# Patient Record
Sex: Male | Born: 1980
Health system: Southern US, Community
[De-identification: ages and names within clinical notes are randomized; demographics above are authoritative.]

## PROBLEM LIST (undated history)

## (undated) DIAGNOSIS — F429 Obsessive-compulsive disorder, unspecified: Secondary | ICD-10-CM

## (undated) HISTORY — DX: Obsessive-compulsive disorder, unspecified: F42.9

---
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
## 2012-01-22 ENCOUNTER — Ambulatory Visit (INDEPENDENT_AMBULATORY_CARE_PROVIDER_SITE_OTHER): Payer: BC Managed Care – PPO | Admitting: Family Medicine

## 2012-01-22 VITALS — BP 112/70 | HR 92 | Temp 97.7°F | Resp 16 | Ht 69.5 in | Wt 148.0 lb

## 2012-01-22 DIAGNOSIS — F429 Obsessive-compulsive disorder, unspecified: Secondary | ICD-10-CM | POA: Insufficient documentation

## 2012-01-22 DIAGNOSIS — R112 Nausea with vomiting, unspecified: Secondary | ICD-10-CM

## 2012-01-22 DIAGNOSIS — F329 Major depressive disorder, single episode, unspecified: Secondary | ICD-10-CM

## 2012-01-22 DIAGNOSIS — R1084 Generalized abdominal pain: Secondary | ICD-10-CM

## 2012-01-22 LAB — POCT URINALYSIS DIPSTICK
Leukocytes, UA: NEGATIVE
Nitrite, UA: NEGATIVE
Urobilinogen, UA: 0.2
pH, UA: 8.5

## 2012-01-22 LAB — COMPREHENSIVE METABOLIC PANEL
ALT: 21 U/L (ref 0–53)
AST: 23 U/L (ref 0–37)
Calcium: 10.3 mg/dL (ref 8.4–10.5)
Chloride: 104 mEq/L (ref 96–112)
Creat: 0.94 mg/dL (ref 0.50–1.35)
Sodium: 139 mEq/L (ref 135–145)
Total Protein: 7.9 g/dL (ref 6.0–8.3)

## 2012-01-22 LAB — POCT CBC
Granulocyte percent: 90.3 %G — AB (ref 37–80)
Lymph, poc: 0.9 (ref 0.6–3.4)
MCHC: 32.7 g/dL (ref 31.8–35.4)
MPV: 10.3 fL (ref 0–99.8)
POC Granulocyte: 11.6 — AB (ref 2–6.9)
POC LYMPH PERCENT: 6.9 %L — AB (ref 10–50)
POC MID %: 2.8 %M (ref 0–12)
Platelet Count, POC: 285 10*3/uL (ref 142–424)
RDW, POC: 13.5 %

## 2012-01-22 LAB — POCT UA - MICROSCOPIC ONLY
Mucus, UA: POSITIVE
Yeast, UA: NEGATIVE

## 2012-01-22 MED ORDER — ONDANSETRON 8 MG PO TBDP
8.0000 mg | ORAL_TABLET | Freq: Three times a day (TID) | ORAL | Status: AC | PRN
Start: 2012-01-22 — End: 2012-01-29

## 2012-01-22 NOTE — Progress Notes (Signed)
  Subjective:    Patient ID: Jeffrey Murillo, male    DOB: 14-Jun-1981, 31 y.o.   MRN: [id number]  HPI  One month ago patient developed acute illness of tactile fever, chills, nausea, vomiting and diarrhea.  After one week patient back to baseline. Today woke up with nausea and abdominal pain after eating out at [employer]. Patient complains of tactile fever and chills.   Patient concerned that he has cholelithiasis, as it "runs in the family"  Very spicy diet. No difficulty with dairy or wheat products.  Patient relates to gastrointestinal distress since childhood.   Review of Systems Joint pains, though working out more No weight loss Night sweats  No oral ulcers Objective:   Physical Exam  Constitutional: He appears well-developed and well-nourished.  HENT:  Head: Normocephalic and atraumatic.  Neck: Neck supple.  Cardiovascular: Normal rate and regular rhythm.   Abdominal: Normal appearance and bowel sounds are normal. There is no hepatosplenomegaly. There is tenderness (RUQ pain + murphy sign). There is positive Murphy's sign.          Assessment & Plan:   1. Abdominal pain POCT CBC, POCT urinalysis dipstick, POCT UA - Microscopic Only, Comprehensive metabolic panel, POCT SEDIMENTATION RATE, Lipase, Tissue transglutaminase, IgA  2. OCD ondansetron (ZOFRAN ODT) 8 MG disintegrating tablet, US Abdomen Complete

## 2012-01-23 NOTE — Progress Notes (Signed)
Quick Note:  Please call patient that labs to date are normal. Keep f/u with Dr. Marylene Land! KR  ______

## 2012-01-24 ENCOUNTER — Telehealth: Payer: Self-pay | Admitting: Family Medicine

## 2012-01-24 NOTE — Telephone Encounter (Signed)
Spoke with pt and he is doing ok. He has appt on feb 13th at 730. He will see you then

## 2012-01-30 ENCOUNTER — Ambulatory Visit
Admission: RE | Admit: 2012-01-30 | Discharge: 2012-01-30 | Disposition: A | Discharge status: Home / Self Care | Payer: BC Managed Care – PPO | Source: Ambulatory Visit | Attending: Family Medicine | Admitting: Family Medicine

## 2012-02-01 NOTE — Telephone Encounter (Signed)
Pt CB and explained the [place] results to pt. Pt wanted me to ask Dr Milus Glazier if he thinks pt should do a repeat US in 6 -12 months. Asked Dr L and then reported to pt that Dr L does not think repeat is necessary. Pt agreed and will RTC is Sxs return. Reports Sxs are gone now.

## 2012-02-01 NOTE — Telephone Encounter (Signed)
Pt would like to know the results of the ultrasound of his abdomen.

## 2012-02-01 NOTE — Telephone Encounter (Signed)
Please see US results

## 2012-02-01 NOTE — Telephone Encounter (Signed)
Dr. Hal Hope is out of the office until Monday.  [place] was negative for any cause of his symptoms, no gallstones, no inflammation.  A very small hemangioma (blood filled cyst) was present on the top of his liver.  This is likely an incidental finding and a repeat US could be repeated at 6-12 months.   Please RTC if Sx persist.

## 2012-02-01 NOTE — Telephone Encounter (Signed)
LMOM TO CB 

## 2012-04-24 ENCOUNTER — Other Ambulatory Visit: Payer: Self-pay | Admitting: Family Medicine

## 2012-05-26 ENCOUNTER — Other Ambulatory Visit: Payer: Self-pay | Admitting: Family Medicine

## 2012-06-22 ENCOUNTER — Ambulatory Visit (INDEPENDENT_AMBULATORY_CARE_PROVIDER_SITE_OTHER): Payer: BC Managed Care – PPO | Admitting: Family Medicine

## 2012-06-22 VITALS — BP 122/62 | HR 60 | Temp 97.8°F | Resp 14 | Ht 70.5 in | Wt 149.8 lb

## 2012-06-22 DIAGNOSIS — B079 Viral wart, unspecified: Secondary | ICD-10-CM

## 2012-06-22 NOTE — Progress Notes (Signed)
31 yo with right handed warts.  Did well with liquid N2 before and they almost went away  O:  5 small hand warts treated with liquid N2  A:  Simple warts  P:  Recheck in 3 weeks.

## 2012-07-25 ENCOUNTER — Ambulatory Visit (INDEPENDENT_AMBULATORY_CARE_PROVIDER_SITE_OTHER): Payer: BC Managed Care – PPO | Admitting: Family Medicine

## 2012-07-25 VITALS — BP 113/69 | HR 62 | Temp 97.5°F | Resp 16 | Ht 69.5 in | Wt 148.8 lb

## 2012-07-25 DIAGNOSIS — J029 Acute pharyngitis, unspecified: Secondary | ICD-10-CM

## 2012-07-25 NOTE — Progress Notes (Signed)
 @  [facility]@   Patient ID: Jeffrey Murillo MRN: [id number], DOB: 1981/03/29, 31 y.o. Date of Encounter: 07/25/2012, 3:27 PM  Primary Physician: No primary provider on file.  Chief Complaint:  Chief Complaint  Patient presents with  . Sore Throat    x 3-4 days gets worse - glands swollen    HPI: 31 y.o. year old male presents with day history of sore throat. Subjective fever and chills. No cough, congestion, rhinorrhea, sinus pressure, otalgia, or headache. Normal hearing. No GI complaints. Able to swallow saliva, but hurts to do so. Decreased appetite secondary to sore throat.   No past medical history on file.   Home Meds: Prior to Admission medications   Medication Sig Start Date End Date Taking? Authorizing Provider  FLUoxetine (PROZAC) 40 MG capsule TAKE ONE CAPSULE BY MOUTH ONE TIME DAILY 04/24/12  Yes Sondra Barges, PA-C    Allergies: No Known Allergies  History   Social History  . Marital Status: Unknown    Spouse Name: N/A    Number of Children: N/A  . Years of Education: N/A   Occupational History  . Not on file.   Social History Main Topics  . Smoking status: Never Smoker   . Smokeless tobacco: Not on file  . Alcohol Use: Not on file  . Drug Use: Not on file  . Sexually Active: Not on file   Other Topics Concern  . Not on file   Social History Narrative  . No narrative on file     Review of Systems: Constitutional: negative for chills, fever, night sweats or weight changes HEENT: see above Cardiovascular: negative for chest pain or palpitations Respiratory: negative for hemoptysis, wheezing, or shortness of breath Abdominal: negative for abdominal pain, nausea, vomiting or diarrhea Dermatological: negative for rash Neurologic: negative for headache   Physical Exam Blood pressure 113/69, pulse 62, temperature 97.5 F (36.4 C), temperature source Oral, resp. rate 16, height 5' 9.5" (1.765 m), weight 148 lb 12.8 oz (67.495 kg), SpO2 100.00%., Body  mass index is 21.66 kg/(m^2). General: Well developed, well nourished, in no acute distress. Head: Normocephalic, atraumatic, eyes without discharge, sclera non-icteric, nares are patent. Bilateral auditory canals clear, TM's are without perforation, pearly grey with reflective cone of light bilaterally. No sinus TTP. Oral cavity moist, dentition normal. Posterior pharynx with post nasal drip and mild erythema. No peritonsillar abscess or tonsillar exudate. Neck: Supple. No thyromegaly. Full ROM. No lymphadenopathy. Lungs: Clear bilaterally to auscultation without wheezes, rales, or rhonchi. Breathing is unlabored. Heart: RRR with S1 S2. No murmurs, rubs, or gallops appreciated. Abdomen: Soft, non-tender, non-distended with normoactive bowel sounds. No hepatomegaly. No rebound/guarding. No obvious abdominal masses. Msk:  Strength and tone normal for age. Extremities: No clubbing or cyanosis. No edema. Neuro: Alert and oriented X 3. Moves all extremities spontaneously. CNII-XII grossly in tact. Psych:  Responds to questions appropriately with a normal affect.   Labs:   ASSESSMENT AND PLAN:  31 y.o. year old male with pharyngitis 1. Pharyngitis  Culture, Group A Strep    - -Tylenol/Motrin prn -Rest/fluids -RTC precautions -RTC 3-5 days if no improvement  Signed, Elvina Sidle, MD 07/25/2012 3:27 PM

## 2012-07-27 LAB — CULTURE, GROUP A STREP: Organism ID, Bacteria: NORMAL

## 2012-09-02 ENCOUNTER — Ambulatory Visit (INDEPENDENT_AMBULATORY_CARE_PROVIDER_SITE_OTHER): Payer: BC Managed Care – PPO | Admitting: Family Medicine

## 2012-09-02 ENCOUNTER — Encounter: Payer: Self-pay | Admitting: Family Medicine

## 2012-09-02 VITALS — BP 120/62 | HR 79 | Temp 97.8°F | Resp 18 | Ht 69.5 in | Wt 148.4 lb

## 2012-09-02 DIAGNOSIS — B079 Viral wart, unspecified: Secondary | ICD-10-CM

## 2012-09-02 DIAGNOSIS — F429 Obsessive-compulsive disorder, unspecified: Secondary | ICD-10-CM

## 2012-09-02 MED ORDER — FLUOXETINE HCL 40 MG PO CAPS: 40.0000 mg | ORAL_CAPSULE | Freq: Every day | ORAL | Status: DC

## 2012-09-02 NOTE — Progress Notes (Signed)
31 yo with right handed warts. Did well with liquid N2 before and they almost went away  O: 3 small hand warts treated with liquid N2: index, middle, and back of index A: Simple warts treated

## 2012-09-02 NOTE — Addendum Note (Signed)
Addended by: Elvina Sidle on: 09/02/2012 01:49 PM   Modules accepted: Orders

## 2012-12-27 ENCOUNTER — Ambulatory Visit: Payer: BC Managed Care – PPO

## 2013-09-12 ENCOUNTER — Other Ambulatory Visit: Payer: Self-pay | Admitting: Family Medicine

## 2013-10-03 ENCOUNTER — Ambulatory Visit (INDEPENDENT_AMBULATORY_CARE_PROVIDER_SITE_OTHER): Payer: BC Managed Care – PPO | Admitting: Family Medicine

## 2013-10-03 VITALS — BP 110/68 | HR 57 | Temp 97.8°F | Resp 18 | Ht 70.0 in | Wt 154.0 lb

## 2013-10-03 DIAGNOSIS — B079 Viral wart, unspecified: Secondary | ICD-10-CM

## 2013-10-03 DIAGNOSIS — R109 Unspecified abdominal pain: Secondary | ICD-10-CM

## 2013-10-03 MED ORDER — METRONIDAZOLE 500 MG PO TABS: 500.0000 mg | ORAL_TABLET | Freq: Three times a day (TID) | ORAL | Status: DC

## 2013-10-03 NOTE — Progress Notes (Signed)
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  Also needs two warts treated.  Objective 2 warts cryo'd right index finger Normal ROM without erythema Abdomen:  Soft, nontender without mass or HSM  Assessment:  Warts and possible giardia  Abdominal pain, unspecified site - Plan: metroNIDAZOLE (FLAGYL) 500 MG tablet  Warts  Signed, Elvina Sidle, MD

## 2013-10-03 NOTE — Patient Instructions (Signed)
Warts Warts are a common viral infection. They are most commonly caused by the human papillomavirus (HPV). Warts can occur at all ages. However, they occur most frequently in older children and infrequently in the elderly. Warts may be single or multiple. Location and size varies. Warts can be spread by scratching the wart and then scratching normal skin. The life cycle of warts varies. However, most will disappear over many months to a couple years. Warts commonly do not cause problems (asymptomatic) unless they are over an area of pressure, such as the bottom of the foot. If they are large enough, they may cause pain with walking. DIAGNOSIS  Warts are most commonly diagnosed by their appearance. Tissue samples (biopsies) are not required unless the wart looks abnormal. Most warts have a rough surface, are round, oval, or irregular, and are skin-colored to light yellow, brown, or gray. They are generally less than  inch (1.3 cm), but they can be any size. TREATMENT   Observation or no treatment.  Freezing with liquid nitrogen.  High heat (cautery).  Boosting the body's immunity to fight off the wart (immunotherapy using Candida antigen).  Laser surgery.  Application of various irritants and solutions. HOME CARE INSTRUCTIONS  Follow your caregiver's instructions. No special precautions are necessary. Often, treatment may be followed by a return (recurrence) of warts. Warts are generally difficult to treat and get rid of. If treatment is done in a clinic setting, usually more than 1 treatment is required. This is usually done on only a monthly basis until the wart is completely gone. SEEK IMMEDIATE MEDICAL CARE IF: The treated skin becomes red, puffy (swollen), or painful. Document Released: 09/12/2005 Document Revised: 02/25/2012 Document Reviewed: 03/10/2010 [facility] Patient Information 2014 [place], [place]. Giardiasis Giardiasis is an infection of the small intestine with the parasite  Giardia intestinalis. Giardia intestinalis cannot be seen with the naked eye. It is often found in unclean (contaminated) water.  CAUSES  Infection can be caused by drinking contaminated water. Giardia intestinalis can also be found in some tap water. SYMPTOMS  An infection causes:  Explosive, foul smelling, watery diarrhea.  A Feeling of sickness in your stomach (nausea).  Abdominal cramps and pain. It takes about 1 to 2 weeks after ingesting infected water or food to get sick. The illness usually lasts 2 to 4 weeks. Infection in infants and children can be long lasting. DIAGNOSIS  It can be diagnosed by stool exam. Blood tests may be needed. TREATMENT  Medications can be given to shorten the course of the illness. HOME CARE INSTRUCTIONS   In areas of contamination, boil your water if possible. Filtering tap water in areas of contamination removes most Giardia. Cysts of Giardia Intestinalis are resistant to chlorine.  Be careful handling soiled undergarments and diapers. If infection is present, it is easily passed by hand to mouth. Use good hand-washing techniques.  Follow up with your caregiver as directed. SEEK MEDICAL CARE IF:  You do not get better. Document Released: 11/30/2000 Document Revised: 02/25/2012 Document Reviewed: 07/22/2008 [facility] Patient Information 2014 [place], [place].

## 2013-10-22 ENCOUNTER — Ambulatory Visit (INDEPENDENT_AMBULATORY_CARE_PROVIDER_SITE_OTHER): Payer: BC Managed Care – PPO | Admitting: Family Medicine

## 2013-10-22 DIAGNOSIS — R1013 Epigastric pain: Secondary | ICD-10-CM

## 2013-10-22 DIAGNOSIS — B079 Viral wart, unspecified: Secondary | ICD-10-CM

## 2013-10-22 DIAGNOSIS — G8929 Other chronic pain: Secondary | ICD-10-CM

## 2013-10-22 NOTE — Progress Notes (Signed)
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  They are smaller but still present  Persistent crampy abdominal pain that comes in waves.  Unrelated to N, V, D.  Made worse by food and not relieved by the flagyl we prescribed several weeks ago  Objective: NAD HEENT: normal Abdomen:  Hyperactive BS, soft, nontender with no masses Skin:  2 warts cryo'd  Assessment:  Two warts on right hand: index finger.  Persistent gastroenteritis Abdominal pain, chronic, epigastric - Plan: Ambulatory referral to Gastroenterology  Wart viral  Signed, Elvina Sidle, MD

## 2014-03-13 ENCOUNTER — Ambulatory Visit: Payer: BC Managed Care – PPO

## 2014-03-13 ENCOUNTER — Ambulatory Visit (INDEPENDENT_AMBULATORY_CARE_PROVIDER_SITE_OTHER): Payer: BC Managed Care – PPO | Admitting: Family Medicine

## 2014-03-13 VITALS — BP 108/70 | HR 62 | Temp 97.5°F | Resp 16 | Ht 69.5 in | Wt 152.0 lb

## 2014-03-13 DIAGNOSIS — R509 Fever, unspecified: Secondary | ICD-10-CM

## 2014-03-13 DIAGNOSIS — R05 Cough: Secondary | ICD-10-CM

## 2014-03-13 DIAGNOSIS — R059 Cough, unspecified: Secondary | ICD-10-CM

## 2014-03-13 MED ORDER — HYDROCODONE-HOMATROPINE 5-1.5 MG/5ML PO SYRP: 5.0000 mL | ORAL_SOLUTION | Freq: Three times a day (TID) | ORAL | Status: DC | PRN

## 2014-03-13 MED ORDER — AZITHROMYCIN 250 MG PO TABS: ORAL_TABLET | ORAL | Status: DC

## 2014-03-13 NOTE — Progress Notes (Signed)
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 His cough has become productive today. He's had no nausea vomiting but he has had some sinus congestion and purulent nasal drainage.  He comes in with his wife today because he 40 minute pneumonia. The symptoms she's experiencing are identical to the ones he had when he had a "double pneumonia" several years ago.  Objective: No acute distress HEENT: Unremarkable with exception of mucopurulent nasal discharge bilaterally Neck: Supple no adenopathy Skin: Clear branches or suspicious lesions Chest: Diffuse expiratory wheezes and rhonchi with bibasilar rales  [facility] reading (PRIMARY) by  Dr. Milus GlazierLauenstein:  Chest x-ray shows increased markings and air trapping bilaterally with a very small effusion on the right and a suggestion of slight eventration of the diaphragm on the left.  Assessment: Acute bronchitis. Patient does not appear to be in acute distress and so I think we can treat this as an outpatient pulmonary infection  Plan: Cough - Plan: DG Chest 2 View, azithromycin (ZITHROMAX Z-PAK) 250 MG tablet, HYDROcodone-homatropine (HYCODAN) 5-1.5 MG/5ML syrup  Fever - Plan: DG Chest 2 View  Signed, Elvina SidleKurt Krizia Flight, MD  .

## 2014-03-18 ENCOUNTER — Encounter: Payer: Self-pay | Admitting: Family Medicine

## 2014-08-27 ENCOUNTER — Other Ambulatory Visit: Payer: Self-pay

## 2014-08-27 MED ORDER — FLUOXETINE HCL 40 MG PO CAPS: ORAL_CAPSULE | ORAL | Status: DC

## 2014-11-15 ENCOUNTER — Other Ambulatory Visit: Payer: Self-pay | Admitting: Family Medicine

## 2014-11-15 MED ORDER — FLUOXETINE HCL 40 MG PO CAPS: ORAL_CAPSULE | ORAL | Status: DC

## 2014-11-16 ENCOUNTER — Encounter: Payer: Self-pay | Admitting: *Deleted

## 2014-11-16 NOTE — Progress Notes (Signed)
Sent message to pt via mychart

## 2015-07-03 ENCOUNTER — Ambulatory Visit (INDEPENDENT_AMBULATORY_CARE_PROVIDER_SITE_OTHER): Payer: BLUE CROSS/BLUE SHIELD

## 2015-07-03 ENCOUNTER — Ambulatory Visit (INDEPENDENT_AMBULATORY_CARE_PROVIDER_SITE_OTHER): Payer: BLUE CROSS/BLUE SHIELD | Admitting: Family Medicine

## 2015-07-03 VITALS — BP 106/68 | HR 65 | Temp 97.9°F | Resp 16 | Ht 70.0 in | Wt 152.0 lb

## 2015-07-03 DIAGNOSIS — J209 Acute bronchitis, unspecified: Secondary | ICD-10-CM

## 2015-07-03 DIAGNOSIS — Z0000 Encounter for general adult medical examination without abnormal findings: Secondary | ICD-10-CM | POA: Diagnosis not present

## 2015-07-03 DIAGNOSIS — F32A Depression, unspecified: Secondary | ICD-10-CM

## 2015-07-03 DIAGNOSIS — F329 Major depressive disorder, single episode, unspecified: Secondary | ICD-10-CM

## 2015-07-03 LAB — POCT CBC
Granulocyte percent: 54.9 %G (ref 37–80)
HCT, POC: 44.1 % (ref 43.5–53.7)
Hemoglobin: 15.2 g/dL (ref 14.1–18.1)
Lymph, poc: 1.6 (ref 0.6–3.4)
MCH, POC: 31 pg (ref 27–31.2)
MCHC: 34.5 g/dL (ref 31.8–35.4)
MCV: 89.7 fL (ref 80–97)
MID (cbc): 0.4 (ref 0–0.9)
MPV: 8.2 fL (ref 0–99.8)
POC Granulocyte: 2.5 (ref 2–6.9)
POC LYMPH PERCENT: 35.5 %L (ref 10–50)
POC MID %: 9.6 %M (ref 0–12)
Platelet Count, POC: 298 10*3/uL (ref 142–424)
RBC: 4.92 M/uL (ref 4.69–6.13)
RDW, POC: 12.7 %
WBC: 4.5 10*3/uL — AB (ref 4.6–10.2)

## 2015-07-03 LAB — POCT UA - MICROSCOPIC ONLY
Bacteria, U Microscopic: NEGATIVE
Casts, Ur, LPF, POC: NEGATIVE
Crystals, Ur, HPF, POC: NEGATIVE
Epithelial cells, urine per micros: NEGATIVE
Mucus, UA: NEGATIVE
Yeast, UA: NEGATIVE

## 2015-07-03 LAB — POCT URINALYSIS DIPSTICK
Bilirubin, UA: NEGATIVE
Blood, UA: NEGATIVE
Glucose, UA: NEGATIVE
Ketones, UA: NEGATIVE
Nitrite, UA: NEGATIVE
Protein, UA: NEGATIVE
Spec Grav, UA: 1.015
Urobilinogen, UA: 0.2
pH, UA: 8.5

## 2015-07-03 LAB — COMPLETE METABOLIC PANEL WITH GFR
ALT: 44 U/L (ref 0–53)
AST: 42 U/L — ABNORMAL HIGH (ref 0–37)
Albumin: 4.7 g/dL (ref 3.5–5.2)
Alkaline Phosphatase: 53 U/L (ref 39–117)
BUN: 23 mg/dL (ref 6–23)
CO2: 29 mEq/L (ref 19–32)
Calcium: 9.9 mg/dL (ref 8.4–10.5)
Chloride: 100 mEq/L (ref 96–112)
Creat: 1.02 mg/dL (ref 0.50–1.35)
GFR, Est African American: >89 mL/min
GFR, Est Non African American: >89 mL/min
Glucose, Bld: 65 mg/dL — ABNORMAL LOW (ref 70–99)
Potassium: 4.1 mEq/L (ref 3.5–5.3)
Sodium: 139 mEq/L (ref 135–145)
Total Bilirubin: 0.7 mg/dL (ref 0.2–1.2)
Total Protein: 7.8 g/dL (ref 6.0–8.3)

## 2015-07-03 LAB — LIPID PANEL
Cholesterol: 168 mg/dL (ref 0–200)
HDL: 79 mg/dL (ref >=40)
LDL Cholesterol: 80 mg/dL (ref 0–99)
Total CHOL/HDL Ratio: 2.1 Ratio
Triglycerides: 46 mg/dL (ref <150)
VLDL: 9 mg/dL (ref 0–40)

## 2015-07-03 LAB — POCT SEDIMENTATION RATE: POCT SED RATE: 18 mm/hr (ref 0–22)

## 2015-07-03 MED ORDER — PREDNISONE 20 MG PO TABS: ORAL_TABLET | ORAL | Status: DC

## 2015-07-03 MED ORDER — FLUOXETINE HCL 40 MG PO CAPS: ORAL_CAPSULE | ORAL | Status: DC

## 2015-07-03 MED ORDER — LEVOFLOXACIN 500 MG PO TABS: 500.0000 mg | ORAL_TABLET | Freq: Every day | ORAL | Status: DC

## 2015-07-03 NOTE — Progress Notes (Addendum)
Patient ID: Jeffrey Murillo, male   DOB: 1981-07-13, 34 y.o.   MRN: [id number]   This chart was scribed for Elvina Sidle, MD by [facility], medical scribe at [facility].The patient was seen in exam room 13 and the patient's care was started at 12:27 PM.  Patient ID: Jeffrey Murillo MRN: [id number], DOB: 1981/08/21, 34 y.o. Date of Encounter: 07/03/2015  Primary Physician: No primary care provider on file.  Chief Complaint:  Chief Complaint  Patient presents with  . URI    not getting better  . Rash  . Annual Exam   HPI:  Jeffrey Murillo is a 34 y.o. male who presents to [facility] complaining of URI symptoms ongoing for one week. Seen at an urgent clinic and given clarithromycin, on his last dose. Feel a bit more energetic but the cough has persisted. He has developed a rash on his back. Pt also has noticed a bad breath, which came before his antibiotic. He does not have an appetite. He a history of pneumonia. Recently returned from [place].   He is also here for a physical exam. He needs refill on his fluoxetine. He's also noticed that after gross the bathroom sometimes have a little bit of leakage later on. He's had no blood in his urine, no back pain, no dysuria, and no incontinence     Past Medical History  Diagnosis Date  . OCD (obsessive compulsive disorder)     Home Meds: Prior to Admission medications   Medication Sig Start Date End Date Taking? Authorizing Provider  clarithromycin (BIAXIN) 500 MG tablet Take 500 mg by mouth 2 (two) times daily.   Yes Historical Provider, MD  FLUoxetine (PROZAC) 40 MG capsule Take one capsule by mouth one time daily. 11/15/14  Yes Elvina Sidle, MD  azithromycin (ZITHROMAX Z-PAK) 250 MG tablet Take as directed on pack Patient not taking: Reported on 07/03/2015 03/13/14   Elvina Sidle, MD    Allergies: No Known Allergies  History   Social History  . Marital Status: Unknown    Spouse  Name: N/A  . Number of Children: N/A  . Years of Education: N/A   Occupational History  . Not on file.   Social History Main Topics  . Smoking status: Never Smoker   . Smokeless tobacco: Not on file  . Alcohol Use: Not on file  . Drug Use: Not on file  . Sexual Activity: Not on file   Other Topics Concern  . Not on file   Social History Narrative    Review of Systems: Constitutional: negative for chills, fever, night sweats, weight changes. Positive for fatigue. HEENT: negative for vision changes, hearing loss, rhinorrhea, ST, epistaxis, or sinus pressure. Positive for congestion Cardiovascular: negative for chest pain or palpitations Respiratory: negative for hemoptysis, shortness of breath. Positive for cough and wheezing. Abdominal: negative for abdominal pain, nausea, vomiting, diarrhea, or constipation Dermatological: positive for rash. Neurologic: negative for headache, dizziness, or syncope All other systems reviewed and are otherwise negative with the exception to those above and in the HPI.  Physical Exam: Blood pressure 106/68, pulse 65, temperature 97.9 F (36.6 C), resp. rate 16, height  (1.778 m), weight 152 lb (68.947 kg), SpO2 98 %., Body mass index is 21.81 kg/(m^2). General: Well developed, well nourished, in no acute distress. Head: Normocephalic, atraumatic, eyes without discharge, sclera non-icteric, nares are without discharge. Bilateral auditory canals clear, TM's are without perforation, pearly grey and translucent with  reflective cone of light bilaterally. Oral cavity moist, posterior pharynx without exudate, erythema, peritonsillar abscess, or post nasal drip.  Neck: Supple. No thyromegaly. Full ROM. No lymphadenopathy. Lungs: Rales in the left lower base. Heart: RRR with S1 S2. No murmurs, rubs, or gallops appreciated. Abdomen: Soft, non-tender, non-distended with normoactive bowel sounds. No hepatomegaly. No rebound/guarding. No obvious abdominal  masses. Msk:  Strength and tone normal for age. Genitalia: Normal testicles, circumcised penis, no hernia Extremities/Skin: Fine morbiliform rash on his upper torso and arms. Neuro: Alert and oriented X 3. Moves all extremities spontaneously. Gait is normal. CNII-XII grossly in tact. Psych:  Responds to questions appropriately with a normal affect.   Labs: Results for orders placed or performed in visit on 07/03/15  POCT CBC  Result Value Ref Range   WBC 4.5 (A) 4.6 - 10.2 K/uL   Lymph, poc 1.6 0.6 - 3.4   POC LYMPH PERCENT 35.5 10 - 50 %L   MID (cbc) 0.4 0 - 0.9   POC MID % 9.6 0 - 12 %M   POC Granulocyte 2.5 2 - 6.9   Granulocyte percent 54.9 37 - 80 %G   RBC 4.92 4.69 - 6.13 M/uL   Hemoglobin 15.2 14.1 - 18.1 g/dL   HCT, POC 56.244.1 13.043.5 - 53.7 %   MCV 89.7 80 - 97 fL   MCH, POC 31.0 27 - 31.2 pg   MCHC 34.5 31.8 - 35.4 g/dL   RDW, POC 86.512.7 %   Platelet Count, POC 298 142 - 424 K/uL   MPV 8.2 0 - 99.8 fL  POCT urinalysis dipstick  Result Value Ref Range   Color, UA Yellow    Clarity, UA clear    Glucose, UA neg    Bilirubin, UA neg    Ketones, UA neg    Spec Grav, UA 1.015    Blood, UA neg    pH, UA 8.5    Protein, UA neg    Urobilinogen, UA 0.2    Nitrite, UA neg    Leukocytes, UA Trace (A) Negative  POCT UA - Microscopic Only  Result Value Ref Range   WBC, Ur, HPF, POC 1-4    RBC, urine, microscopic 0-1    Bacteria, U Microscopic neg    Mucus, UA neg    Epithelial cells, urine per micros neg    Crystals, Ur, HPF, POC neg    Casts, Ur, LPF, POC neg    Yeast, UA neg    [facility] reading (PRIMARY) by  Dr. Milus GlazierLauenstein:  CXR shows hyperinflation with some peribronchial cuffing.    ASSESSMENT AND PLAN:  34 y.o. year old male with  This chart was scribed in my presence and reviewed by me personally.    ICD-9-CM ICD-10-CM   1. Acute bronchitis, unspecified organism 466.0 J20.9 DG Chest 2 View     levofloxacin (LEVAQUIN) 500 MG tablet     predniSONE (DELTASONE) 20 MG  tablet  2. Annual physical exam V70.0 Z00.00 POCT CBC     POCT urinalysis dipstick     POCT UA - Microscopic Only     POCT SEDIMENTATION RATE     COMPLETE METABOLIC PANEL WITH GFR     Lipid panel  3. Depression 311 F32.9 FLUoxetine (PROZAC) 40 MG capsule      Signed, Elvina SidleKurt Yavier Snider, MD 07/03/2015 12:27 PM

## 2015-07-03 NOTE — Patient Instructions (Signed)
Acute Bronchitis Bronchitis is inflammation of the airways that extend from the windpipe into the lungs (bronchi). The inflammation often causes mucus to develop. This leads to a cough, which is the most common symptom of bronchitis.  In acute bronchitis, the condition usually develops suddenly and goes away over time, usually in a couple weeks. Smoking, allergies, and asthma can make bronchitis worse. Repeated episodes of bronchitis may cause further lung problems.  CAUSES Acute bronchitis is most often caused by the same virus that causes a cold. The virus can spread from person to person (contagious) through coughing, sneezing, and touching contaminated objects. SIGNS AND SYMPTOMS   Cough.   Fever.   Coughing up mucus.   Body aches.   Chest congestion.   Chills.   Shortness of breath.   Sore throat.  DIAGNOSIS  Acute bronchitis is usually diagnosed through a physical exam. Your health care provider will also ask you questions about your medical history. Tests, such as chest X-rays, are sometimes done to rule out other conditions.  TREATMENT  Acute bronchitis usually goes away in a couple weeks. Oftentimes, no medical treatment is necessary. Medicines are sometimes given for relief of fever or cough. Antibiotic medicines are usually not needed but may be prescribed in certain situations. In some cases, an inhaler may be recommended to help reduce shortness of breath and control the cough. A cool mist vaporizer may also be used to help thin bronchial secretions and make it easier to clear the chest.  HOME CARE INSTRUCTIONS  Get plenty of rest.   Drink enough fluids to keep your urine clear or pale yellow (unless you have a medical condition that requires fluid restriction). Increasing fluids may help thin your respiratory secretions (sputum) and reduce chest congestion, and it will prevent dehydration.   Take medicines only as directed by your health care provider.  If  you were prescribed an antibiotic medicine, finish it all even if you start to feel better.  Avoid smoking and secondhand smoke. Exposure to cigarette smoke or irritating chemicals will make bronchitis worse. If you are a smoker, consider using nicotine gum or skin patches to help control withdrawal symptoms. Quitting smoking will help your lungs heal faster.   Reduce the chances of another bout of acute bronchitis by washing your hands frequently, avoiding people with cold symptoms, and trying not to touch your hands to your mouth, nose, or eyes.   Keep all follow-up visits as directed by your health care provider.  SEEK MEDICAL CARE IF: Your symptoms do not improve after 1 week of treatment.  SEEK IMMEDIATE MEDICAL CARE IF:  You develop an increased fever or chills.   You have chest pain.   You have severe shortness of breath.  You have bloody sputum.   You develop dehydration.  You faint or repeatedly feel like you are going to pass out.  You develop repeated vomiting.  You develop a severe headache. MAKE SURE YOU:   Understand these instructions.  Will watch your condition.  Will get help right away if you are not doing well or get worse. Document Released: 01/10/2005 Document Revised: 04/19/2014 Document Reviewed: 05/26/2013 ExitCare Patient Information 2015 [employer]. This information is not intended to replace advice given to you by your health care provider. Make sure you discuss any questions you have with your health care provider. Health Maintenance A healthy lifestyle and preventative care can promote health and wellness.  Maintain regular health, dental, and eye exams.  Eat a   healthy diet. Foods like vegetables, fruits, whole grains, low-fat dairy products, and lean protein foods contain the nutrients you need and are low in calories. Decrease your intake of foods high in solid fats, added sugars, and salt. Get information about a proper diet from your  health care provider, if necessary.  Regular physical exercise is one of the most important things you can do for your health. Most adults should get at least 150 minutes of moderate-intensity exercise (any activity that increases your heart rate and causes you to sweat) each week. In addition, most adults need muscle-strengthening exercises on 2 or more days a week.   Maintain a healthy weight. The body mass index (BMI) is a screening tool to identify possible weight problems. It provides an estimate of body fat based on height and weight. Your health care provider can find your BMI and can help you achieve or maintain a healthy weight. For males 20 years and older:  A BMI below 18.5 is considered underweight.  A BMI of 18.5 to 24.9 is normal.  A BMI of 25 to 29.9 is considered overweight.  A BMI of 30 and above is considered obese.  Maintain normal blood lipids and cholesterol by exercising and minimizing your intake of saturated fat. Eat a balanced diet with plenty of fruits and vegetables. Blood tests for lipids and cholesterol should begin at age 20 and be repeated every 5 years. If your lipid or cholesterol levels are high, you are over age 50, or you are at high risk for heart disease, you may need your cholesterol levels checked more frequently.Ongoing high lipid and cholesterol levels should be treated with medicines if diet and exercise are not working.  If you smoke, find out from your health care provider how to quit. If you do not use tobacco, do not start.  Lung cancer screening is recommended for adults aged 55-80 years who are at high risk for developing lung cancer because of a history of smoking. A yearly low-dose CT scan of the lungs is recommended for people who have at least a 30-pack-year history of smoking and are current smokers or have quit within the past 15 years. A pack year of smoking is smoking an average of 1 pack of cigarettes a day for 1 year (for example, a  30-pack-year history of smoking could mean smoking 1 pack a day for 30 years or 2 packs a day for 15 years). Yearly screening should continue until the smoker has stopped smoking for at least 15 years. Yearly screening should be stopped for people who develop a health problem that would prevent them from having lung cancer treatment.  If you choose to drink alcohol, do not have more than 2 drinks per day. One drink is considered to be 12 oz (360 mL) of beer, 5 oz (150 mL) of wine, or 1.5 oz (45 mL) of liquor.  Avoid the use of street drugs. Do not share needles with anyone. Ask for help if you need support or instructions about stopping the use of drugs.  High blood pressure causes heart disease and increases the risk of stroke. Blood pressure should be checked at least every 1-2 years. Ongoing high blood pressure should be treated with medicines if weight loss and exercise are not effective.  If you are 45-79 years old, ask your health care provider if you should take aspirin to prevent heart disease.  Diabetes screening involves taking a blood sample to check your fasting blood sugar level.   This should be done once every 3 years after age 45 if you are at a normal weight and without risk factors for diabetes. Testing should be considered at a younger age or be carried out more frequently if you are overweight and have at least 1 risk factor for diabetes.  Colorectal cancer can be detected and often prevented. Most routine colorectal cancer screening begins at the age of 50 and continues through age 75. However, your health care provider may recommend screening at an earlier age if you have risk factors for colon cancer. On a yearly basis, your health care provider may provide home test kits to check for hidden blood in the stool. A small camera at the end of a tube may be used to directly examine the colon (sigmoidoscopy or colonoscopy) to detect the earliest forms of colorectal cancer. Talk to your  health care provider about this at age 50 when routine screening begins. A direct exam of the colon should be repeated every 5-10 years through age 75, unless early forms of precancerous polyps or small growths are found.  People who are at an increased risk for hepatitis B should be screened for this virus. You are considered at high risk for hepatitis B if:  You were born in a country where hepatitis B occurs often. Talk with your health care provider about which countries are considered high risk.  Your parents were born in a high-risk country and you have not received a shot to protect against hepatitis B (hepatitis B vaccine).  You have HIV or AIDS.  You use needles to inject street drugs.  You live with, or have sex with, someone who has hepatitis B.  You are a man who has sex with other men (MSM).  You get hemodialysis treatment.  You take certain medicines for conditions like cancer, organ transplantation, and autoimmune conditions.  Hepatitis C blood testing is recommended for all people born from 1945 through 1965 and any individual with known risk factors for hepatitis C.  Healthy men should no longer receive prostate-specific antigen (PSA) blood tests as part of routine cancer screening. Talk to your health care provider about prostate cancer screening.  Testicular cancer screening is not recommended for adolescents or adult males who have no symptoms. Screening includes self-exam, a health care provider exam, and other screening tests. Consult with your health care provider about any symptoms you have or any concerns you have about testicular cancer.  Practice safe sex. Use condoms and avoid high-risk sexual practices to reduce the spread of sexually transmitted infections (STIs).  You should be screened for STIs, including gonorrhea and chlamydia if:  You are sexually active and are younger than 24 years.  You are older than 24 years, and your health care provider tells  you that you are at risk for this type of infection.  Your sexual activity has changed since you were last screened, and you are at an increased risk for chlamydia or gonorrhea. Ask your health care provider if you are at risk.  If you are at risk of being infected with HIV, it is recommended that you take a prescription medicine daily to prevent HIV infection. This is called pre-exposure prophylaxis (PrEP). You are considered at risk if:  You are a man who has sex with other men (MSM).  You are a heterosexual man who is sexually active with multiple partners.  You take drugs by injection.  You are sexually active with a partner who has HIV.    Talk with your health care provider about whether you are at high risk of being infected with HIV. If you choose to begin PrEP, you should first be tested for HIV. You should then be tested every 3 months for as long as you are taking PrEP.  Use sunscreen. Apply sunscreen liberally and repeatedly throughout the day. You should seek shade when your shadow is shorter than you. Protect yourself by wearing long sleeves, pants, a wide-brimmed hat, and sunglasses year round whenever you are outdoors.  Tell your health care provider of new moles or changes in moles, especially if there is a change in shape or color. Also, tell your health care provider if a mole is larger than the size of a pencil eraser.  A one-time screening for abdominal aortic aneurysm (AAA) and surgical repair of large AAAs by ultrasound is recommended for men aged 65-75 years who are current or former smokers.  Stay current with your vaccines (immunizations). Document Released: 05/31/2008 Document Revised: 12/08/2013 Document Reviewed: 04/30/2011 ExitCare Patient Information 2015 [employer]. This information is not intended to replace advice given to you by your health care provider. Make sure you discuss any questions you have with your health care provider.  

## 2016-08-27 ENCOUNTER — Other Ambulatory Visit: Payer: Self-pay | Admitting: Family Medicine

## 2016-08-27 DIAGNOSIS — F32A Depression, unspecified: Secondary | ICD-10-CM

## 2016-08-27 DIAGNOSIS — F329 Major depressive disorder, single episode, unspecified: Secondary | ICD-10-CM

## 2016-08-27 MED ORDER — FLUOXETINE HCL 40 MG PO CAPS: ORAL_CAPSULE | ORAL | 3 refills | Status: DC

## 2016-12-06 ENCOUNTER — Encounter: Payer: Self-pay | Admitting: Family Medicine

## 2016-12-06 ENCOUNTER — Other Ambulatory Visit: Payer: 59 | Admitting: *Deleted

## 2016-12-06 ENCOUNTER — Other Ambulatory Visit: Payer: Self-pay | Admitting: Family Medicine

## 2016-12-06 DIAGNOSIS — Z0000 Encounter for general adult medical examination without abnormal findings: Secondary | ICD-10-CM

## 2016-12-06 NOTE — Progress Notes (Unsigned)
S:  Here for CPE  Some gi problems intermittently related to eating Occasional epistaxis related to dry humidity  Social hx:  Daily exercise, focusing on fitness for tennis.  Family hx: parents are in good health, remote hx prostate and throat ca in grandparents  PMHx:  No surgery, no meds, no allergies.  Objective:  Weight: 155  Height  5'10"  BP 106/62  Pulse 50  Resp 12 HEENT:  Normal with exception of mild irritation right septum Neck:  Supple, no adenop or thyromegaly Chest:  Clear Heart: reg, no murmur, gallop, or irregularity Abdomen:  Scaphoid, no masses or hepatomegaly.  Slight discomfort right lateral abdomen G-U:  Normal circ male, no hernia Ext:  Normal pulses, no deformity Back:  No scoliosis Skin:  No worrisome lesions  Assessment:  Healthy male with no acute problems. Probable IBS  Plan:  Check routine labs. Continue active, healthy lifestyle  Elvina SidleKurt Julieth Tugman, MD

## 2016-12-08 LAB — COMPREHENSIVE METABOLIC PANEL
ALT: 29 IU/L (ref 0–44)
AST: 21 IU/L (ref 0–40)
Albumin/Globulin Ratio: 1.6 (ref 1.2–2.2)
Albumin: 4.4 g/dL (ref 3.5–5.5)
Alkaline Phosphatase: 55 IU/L (ref 39–117)
BUN/Creatinine Ratio: 22 — ABNORMAL HIGH (ref 9–20)
BUN: 18 mg/dL (ref 6–20)
Bilirubin Total: 0.2 mg/dL (ref 0.0–1.2)
CO2: 25 mmol/L (ref 18–29)
Calcium: 9.3 mg/dL (ref 8.7–10.2)
Chloride: 102 mmol/L (ref 96–106)
Creatinine, Ser: 0.82 mg/dL (ref 0.76–1.27)
GFR calc Af Amer: 134 mL/min/{1.73_m2} (ref >59)
GFR calc non Af Amer: 116 mL/min/{1.73_m2} (ref >59)
Globulin, Total: 2.8 g/dL (ref 1.5–4.5)
Glucose: 82 mg/dL (ref 65–99)
Potassium: 4.1 mmol/L (ref 3.5–5.2)
Sodium: 141 mmol/L (ref 134–144)
Total Protein: 7.2 g/dL (ref 6.0–8.5)

## 2016-12-08 LAB — LIPID PANEL
Chol/HDL Ratio: 2 ratio units (ref 0.0–5.0)
Cholesterol, Total: 163 mg/dL (ref 100–199)
HDL: 81 mg/dL (ref >39)
LDL Calculated: 75 mg/dL (ref 0–99)
Triglycerides: 33 mg/dL (ref 0–149)
VLDL Cholesterol Cal: 7 mg/dL (ref 5–40)

## 2017-01-24 ENCOUNTER — Other Ambulatory Visit: Payer: Self-pay | Admitting: Sports Medicine

## 2017-01-24 ENCOUNTER — Ambulatory Visit (INDEPENDENT_AMBULATORY_CARE_PROVIDER_SITE_OTHER): Payer: 59 | Admitting: Sports Medicine

## 2017-01-24 ENCOUNTER — Ambulatory Visit
Admission: RE | Admit: 2017-01-24 | Discharge: 2017-01-24 | Disposition: A | Discharge status: Home / Self Care | Payer: 59 | Source: Ambulatory Visit | Attending: Sports Medicine | Admitting: Sports Medicine

## 2017-01-24 ENCOUNTER — Encounter: Payer: Self-pay | Admitting: Sports Medicine

## 2017-01-24 VITALS — BP 115/76 | HR 65 | Ht 70.0 in | Wt 155.0 lb

## 2017-01-24 DIAGNOSIS — M25561 Pain in right knee: Secondary | ICD-10-CM

## 2017-01-24 DIAGNOSIS — M958 Other specified acquired deformities of musculoskeletal system: Secondary | ICD-10-CM

## 2017-01-24 NOTE — Progress Notes (Signed)
  Curtis Sitesimothy Kowal - 36 y.o. male MRN [id number]  Date of birth: [birth year]  SUBJECTIVE:  Including CC & ROS.  CC: right knee pain  Marcial Pacasimothy is a 36 yo male presenting with pain and swelling in his right knee. He denies an inciting injury or event. He has a history of his right knee occasionally giving out and occasionally locking up. When he was 21, he had increased pain similar to what he has currently and was told he had a loose cartilage piece. He reports that last week, his knee gave out several times in one day. The next day, he was able to play tennis without pain, but after he played he had significant swelling in his knee. He has been icing it and taking ibuprofen but he has had pain with bending his knee and jumping.  He plays tennis and soccer. He works at [employer].    ROS: No unexpected weight loss, fever, chills, swelling, instability, muscle pain, numbness/tingling, redness, otherwise see HPI   PMHx - Updated and reviewed.  Contributory factors include: Negative PSHx - Updated and reviewed.  Contributory factors include:  Negative FHx - Updated and reviewed.  Contributory factors include:  Negative Social Hx - Updated and reviewed. Contributory factors include: Negative Medications - reviewed   PHYSICAL EXAM:  VS: BP:115/76  HR:65bpm  TEMP: ( )  RESP:   HT:5\' 10"  (177.8 cm)   WT:155 lb (70.3 kg)  BMI:22.3 PHYSICAL EXAM: Gen: NAD, alert, cooperative with exam, well-appearing HEENT: clear conjunctiva,  CV:  no edema, capillary refill brisk, normal rate Resp: non-labored Skin: no rashes, normal turgor  Neuro: no gross deficits.  Psych:  alert and oriented  Normal to inspection with no erythema or obvious bony abnormalities. Suprapatellar effusion noted. Palpation normal with no warmth, joint line tenderness, patellar tenderness, or condyle tenderness. TTP at anteromedial joint space ROM full in flexion and extension and lower leg rotation. Ligaments with solid  consistent endpoints including ACL, PCL, LCL, MCL. Negative Mcmurray's, Apley's. Positive Thessalonian tests. Non painful patellar compression. Patellar glide with crepitus bilaterally. Patellar and quadriceps tendons unremarkable. Hamstring and quadriceps strength is normal.  Hip abduction strength intact bilaterally.  Imaging: X ray right knee: osteochondral defect involving the medial right femoral condyle, right knee joint effusion  ASSESSMENT & PLAN:  Osteochondral defect, right: History and exam consistent with likely an intra-articular loose body that is causing pain, occasional locking out/giving away of knee. Possible meniscal tear given pain Thessalonianstest on exam. X ray confirmed an osteochondral defect of the right femoral condyle  Plan: - Obtain MRI to evaluate for loose body vs meniscal tear - Will refer to ortho for scope   Patient seen and evaluated with the resident. I agree with the above plan of care. Patient's x-rays show an osteochondral defect of the right medial femoral condyle. We will get an MRI to evaluate further as well as to rule out a loose body from this OCD. Phone follow-up after I reviewed that study. Patient will likely need operative intervention and we will discuss referral to orthopedics after I review his MRI.

## 2017-01-31 ENCOUNTER — Other Ambulatory Visit: Payer: 59

## 2017-06-06 ENCOUNTER — Ambulatory Visit (INDEPENDENT_AMBULATORY_CARE_PROVIDER_SITE_OTHER): Payer: 59 | Admitting: Sports Medicine

## 2017-06-06 DIAGNOSIS — M958 Other specified acquired deformities of musculoskeletal system: Secondary | ICD-10-CM | POA: Diagnosis not present

## 2017-06-06 NOTE — Progress Notes (Signed)
  Jeffrey Murillo - 36 y.o. male MRN [id number]  Date of birth: 01-Oct-1981  SUBJECTIVE:  Including CC & ROS.  CC: right knee pain  Patient presents with right knee pain that is been ongoing since February. He was seen and x-rays were performed which showed an OCD lesion in his medial femoral condyle. An MRI was ordered, but he had this done in [place]. He brings his report in today. He had a surgical opinion in which they recommended doing an open knee surgery to remove the loose fragment and do a microfracture. He would like to do this arthroscopically if possible. He also discussed the new study in which collagen was used to regenerate cartilage. He has been doing this for a month and was having good results until 5 days ago when he started trying to exercise again. He enjoys playing soccer, tennis, running, swimming, biking, weightlifting. He works at [employer].  He was discussing that he would like to try the collagen for another 6 months before doing a surgical procedure if possible.  Of note, the MRI also showed an endochondroma in the metaphysis of his distal femur. He would like this to be followed through his orthopedic oncologist in [place]. He will send him x-rays every 6 months. It is currently not bothering him.   ROS: No unexpected weight loss, fever, chills, instability, muscle pain, numbness/tingling, redness, otherwise see HPI   PMHx - Updated and reviewed.  Contributory factors include: Negative PSHx - Updated and reviewed.  Contributory factors include:  Negative FHx - Updated and reviewed.  Contributory factors include:  Negative Social Hx - Updated and reviewed. Contributory factors include: Negative Medications - reviewed   DATA REVIEWED: MRI 03/29/17 in [place]- right knee with OCD lesion of medial femoral condyle. Joint effusion present. Endochondroma in the metaphysis of distal femur  PHYSICAL EXAM:  VS: BP:(!) 104/56  HR: bpm  TEMP: ( )  RESP:   HT:    WT:    BMI:  PHYSICAL EXAM: Gen: NAD, alert, cooperative with exam, well-appearing HEENT: clear conjunctiva,  CV:  no edema, capillary refill brisk, normal rate Resp: non-labored Skin: no rashes, normal turgor  Neuro: no gross deficits.  Psych:  alert and oriented  Knee: Inspection with no erythema or but does have effusion in right knee. Palpation with no warmth, joint line tenderness, patellar tenderness, or condyle tenderness. 2+ joint effusion in right knee ROM mildly decreased in flexion of right knee, but normal in extension and lower leg rotation. Ligaments with solid consistent endpoints including ACL, PCL, LCL, MCL. Negative Mcmurray's, Apley's, and Thessalonian tests. Non painful patellar compression. Patellar glide without crepitus. Patellar and quadriceps tendons unremarkable. Hamstring and quadriceps strength is normal.     ASSESSMENT & PLAN:   Osteochondral defect of femoral condyle Will get him an ice compression unit for his knee from [employer].  Recommend avoiding activities that aggravate it. Continue collagen. He has an appointment with Dr. Thurston HoleWainer to discuss knee arthroscopy with microfracture and loose body removal.    Patient was counseled reviewing diagnosis and treatment in detail, totaling in 45 minutes, over half of which was spent in face to face counseling.

## 2017-06-06 NOTE — Assessment & Plan Note (Signed)
Will get him an ice compression unit for his knee from [employer].  Recommend avoiding activities that aggravate it. Continue collagen. He has an appointment with Dr. Thurston HoleWainer to discuss knee arthroscopy with microfracture and loose body removal.

## 2017-06-06 NOTE — Patient Instructions (Signed)
[facility] Thurston HoleWainer [address] [place] [id number]  Tuesday June 26th at 845a Arrival time is [phone]  [phone]

## 2018-01-20 ENCOUNTER — Ambulatory Visit (HOSPITAL_COMMUNITY): Admission: EM | Admit: 2018-01-20 | Discharge: 2018-01-20 | Discharge status: Against Medical Advice | Payer: 59

## 2018-01-20 NOTE — ED Notes (Signed)
Per pt access, pt did not want to wait and left the building 

## 2018-01-29 ENCOUNTER — Other Ambulatory Visit: Payer: Self-pay

## 2018-01-29 ENCOUNTER — Ambulatory Visit (INDEPENDENT_AMBULATORY_CARE_PROVIDER_SITE_OTHER): Payer: 59

## 2018-01-29 ENCOUNTER — Ambulatory Visit (HOSPITAL_COMMUNITY)
Admission: EM | Admit: 2018-01-29 | Discharge: 2018-01-29 | Disposition: A | Discharge status: Home / Self Care | Payer: 59 | Attending: Family Medicine | Admitting: Family Medicine

## 2018-01-29 ENCOUNTER — Encounter (HOSPITAL_COMMUNITY): Payer: Self-pay | Admitting: Emergency Medicine

## 2018-01-29 DIAGNOSIS — R42 Dizziness and giddiness: Secondary | ICD-10-CM | POA: Diagnosis not present

## 2018-01-29 DIAGNOSIS — R55 Syncope and collapse: Secondary | ICD-10-CM

## 2018-01-29 DIAGNOSIS — M79605 Pain in left leg: Secondary | ICD-10-CM

## 2018-01-29 DIAGNOSIS — R52 Pain, unspecified: Secondary | ICD-10-CM

## 2018-01-29 DIAGNOSIS — F329 Major depressive disorder, single episode, unspecified: Secondary | ICD-10-CM | POA: Diagnosis not present

## 2018-01-29 DIAGNOSIS — G5622 Lesion of ulnar nerve, left upper limb: Secondary | ICD-10-CM

## 2018-01-29 DIAGNOSIS — F32A Depression, unspecified: Secondary | ICD-10-CM

## 2018-01-29 MED ORDER — FLUOXETINE HCL 40 MG PO CAPS: ORAL_CAPSULE | ORAL | 1 refills | Status: DC

## 2018-01-29 NOTE — ED Provider Notes (Signed)
[facility]   [id number] 01/29/18 Arrival Time: 1939   SUBJECTIVE:  Jeffrey Murillo is a 37 y.o. male who presents to the urgent care with complaint of last few weeks  off and on L chest pain. Pt states a month ago he was running and he had chest pain with some L sided arm numbness and bilateral feet numbness. Denies CP at this time.  He has had left elbow pain and sensitivity for several months with simultaneous numbness in the flexor fingers of his left hand.  He does a lot of exercise including forearm curls.  Patient also has noted from time to time lightheadedness when he gets up quickly.  On one occasion this was associated with palpitations and sweatiness and clammy fingers.  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  Finally, patient is asking for a refill of his Prozac for 6 months Prozac spending an extended time living in [place]   Past Medical History:  Diagnosis Date  . OCD (obsessive compulsive disorder)    Family History  Problem Relation Age of Onset  . Hyperlipidemia Father    Social History   Socioeconomic History  . Marital status: Unknown    Spouse name: Not on file  . Number of children: Not on file  . Years of education: Not on file  . Highest education level: Not on file  Social Needs  . Financial resource strain: Not on file  . Food insecurity - worry: Not on file  . Food insecurity - inability: Not on file  . Transportation needs - medical: Not on file  . Transportation needs - non-medical: Not on file  Occupational History  . Not on file  Tobacco Use  . Smoking status: Never Smoker  . Smokeless tobacco: Never Used  Substance and Sexual Activity  . Alcohol use: Not on file  . Drug use: Not on file  . Sexual activity: Not on file  Other Topics Concern  . Not on file  Social History Narrative  . Not on file   No outpatient medications have been marked as taking for the 01/29/18 encounter  [facility] Encounter).   No Known Allergies    ROS: As per HPI, remainder of ROS negative.   OBJECTIVE:   Vitals:   01/29/18 2031  BP: 109/62  Pulse: 63  Resp: 18  Temp: 97.7 F (36.5 C)  SpO2: 100%     General appearance: alert; no distress Eyes: PERRL; EOMI; conjunctiva normal HENT: normocephalic; atraumatic; ; oral mucosa normal Neck: supple Lungs: clear to auscultation bilaterally Heart: regular rate and rhythm Back: no CVA tenderness Extremities: no cyanosis or edema; symmetrical with no gross deformities; tender ulnar nerve at the elbow. Skin: warm and dry Neurologic: normal gait; grossly normal Psychological: alert and cooperative; normal mood and affect      Labs:  Results for orders placed or performed in visit on 12/06/16  Lipid panel  Result Value Ref Range   Cholesterol, Total 163 100 - 199 mg/dL   Triglycerides 33 0 - 149 mg/dL   HDL 81 >11 mg/dL   VLDL Cholesterol Cal 7 5 - 40 mg/dL   LDL Calculated 75 0 - 99 mg/dL   Chol/HDL Ratio 2.0 0.0 - 5.0 ratio units  Comprehensive metabolic panel  Result Value Ref Range   Glucose 82 65 - 99 mg/dL   BUN 18 6 - 20 mg/dL   Creatinine, Ser 9.14 0.76 - 1.27 mg/dL  GFR calc non Af Amer 116 >59 mL/min/1.73   GFR calc Af Amer 134 >59 mL/min/1.73   BUN/Creatinine Ratio 22 (H) 9 - 20   Sodium 141 134 - 144 mmol/L   Potassium 4.1 3.5 - 5.2 mmol/L   Chloride 102 96 - 106 mmol/L   CO2 25 18 - 29 mmol/L   Calcium 9.3 8.7 - 10.2 mg/dL   Total Protein 7.2 6.0 - 8.5 g/dL   Albumin 4.4 3.5 - 5.5 g/dL   Globulin, Total 2.8 1.5 - 4.5 g/dL   Albumin/Globulin Ratio 1.6 1.2 - 2.2   Bilirubin Total 0.2 0.0 - 1.2 mg/dL   Alkaline Phosphatase 55 39 - 117 IU/L   AST 21 0 - 40 IU/L   ALT 29 0 - 44 IU/L    Labs Reviewed - No data to display  No results found.     ASSESSMENT & PLAN:  1. Ulnar tunnel syndrome, left   2. Pain   3. Depression   4. Postural dizziness with presyncope   5. Left leg pain      Meds ordered this encounter  Medications  . FLUoxetine (PROZAC) 40 MG capsule    Sig: Take one capsule by mouth one time daily.    Dispense:  180 capsule    Refill:  1    Please contact patient when Rx ready    Reviewed expectations re: course of current medical issues. Questions answered. Outlined signs and symptoms indicating need for more acute intervention. Patient verbalized understanding. After Visit Summary given.    Procedures:  We discussed several problems this evening: 1.  Ulnar tunnel syndrome at the left elbow causing hypersensitivity and subsequent numbness in the fingers of the left hand.  This comes from muscle hypertrophy in the forearm and unless it causes worsening symptoms, does not need treatment  2.  I will write medicine for the anxiety symptoms that you have suffered in the past since this is well controlled and this is a non-controlled medication.  3.  The symptoms you experience with dizziness are most likely secondary to a low heart rate and mild dehydration such that when you stand up quickly after bending over, cardiac output drops briefly.  4.  The bone cyst in the right femur shows no change.    Elvina SidleLauenstein, Antanisha Mohs, MD 01/29/18 2121

## 2018-01-29 NOTE — Discharge Instructions (Signed)
We discussed several problems this evening: 1.  Ulnar tunnel syndrome at the left elbow causing hypersensitivity and subsequent numbness in the fingers of the left hand.  This comes from muscle hypertrophy in the forearm and unless it causes worsening symptoms, does not need treatment  2.  I will write medicine for the anxiety symptoms that you have suffered in the past since this is well controlled and this is a non-controlled medication.  3.  The symptoms you experience with dizziness are most likely secondary to a low heart rate and mild dehydration such that when you stand up quickly after bending over, cardiac output drops briefly.  4.  The bone cyst in the right femur shows no change.   Labs:  Results for orders placed or performed in visit on 12/06/16  Lipid panel  Result Value Ref Range   Cholesterol, Total 163 100 - 199 mg/dL   Triglycerides 33 0 - 149 mg/dL   HDL 81 >16>39 mg/dL   VLDL Cholesterol Cal 7 5 - 40 mg/dL   LDL Calculated 75 0 - 99 mg/dL   Chol/HDL Ratio 2.0 0.0 - 5.0 ratio units  Comprehensive metabolic panel  Result Value Ref Range   Glucose 82 65 - 99 mg/dL   BUN 18 6 - 20 mg/dL   Creatinine, Ser 1.090.82 0.76 - 1.27 mg/dL   GFR calc non Af Amer 116 >59 mL/min/1.73   GFR calc Af Amer 134 >59 mL/min/1.73   BUN/Creatinine Ratio 22 (H) 9 - 20   Sodium 141 134 - 144 mmol/L   Potassium 4.1 3.5 - 5.2 mmol/L   Chloride 102 96 - 106 mmol/L   CO2 25 18 - 29 mmol/L   Calcium 9.3 8.7 - 10.2 mg/dL   Total Protein 7.2 6.0 - 8.5 g/dL   Albumin 4.4 3.5 - 5.5 g/dL   Globulin, Total 2.8 1.5 - 4.5 g/dL   Albumin/Globulin Ratio 1.6 1.2 - 2.2   Bilirubin Total 0.2 0.0 - 1.2 mg/dL   Alkaline Phosphatase 55 39 - 117 IU/L   AST 21 0 - 40 IU/L   ALT 29 0 - 44 IU/L

## 2018-01-29 NOTE — ED Triage Notes (Signed)
Pt states for the last few weeks hes had off and on L chest pain. Pt states a month ago he was running and he had chest pain with some L sided arm numbness and bilateral feet numbness. Denies CP at this time.

## 2019-04-26 IMAGING — DX DG FEMUR 2+V*R*
4 series · 4 of 4 positions shown · non-contrast
Comparison: Knee film 01/24/2017

CLINICAL DATA: Per pt: has a benign tumor in the right femur,
oncologist requested the x-ray to determine if there is any change.
Patient has osteochondritis in the right knee, no severe pain.
Patient walked to the exam room. Patient is not a diabetic

EXAM:
RIGHT FEMUR 2 VIEWS

[femur ap (1 of 2)]
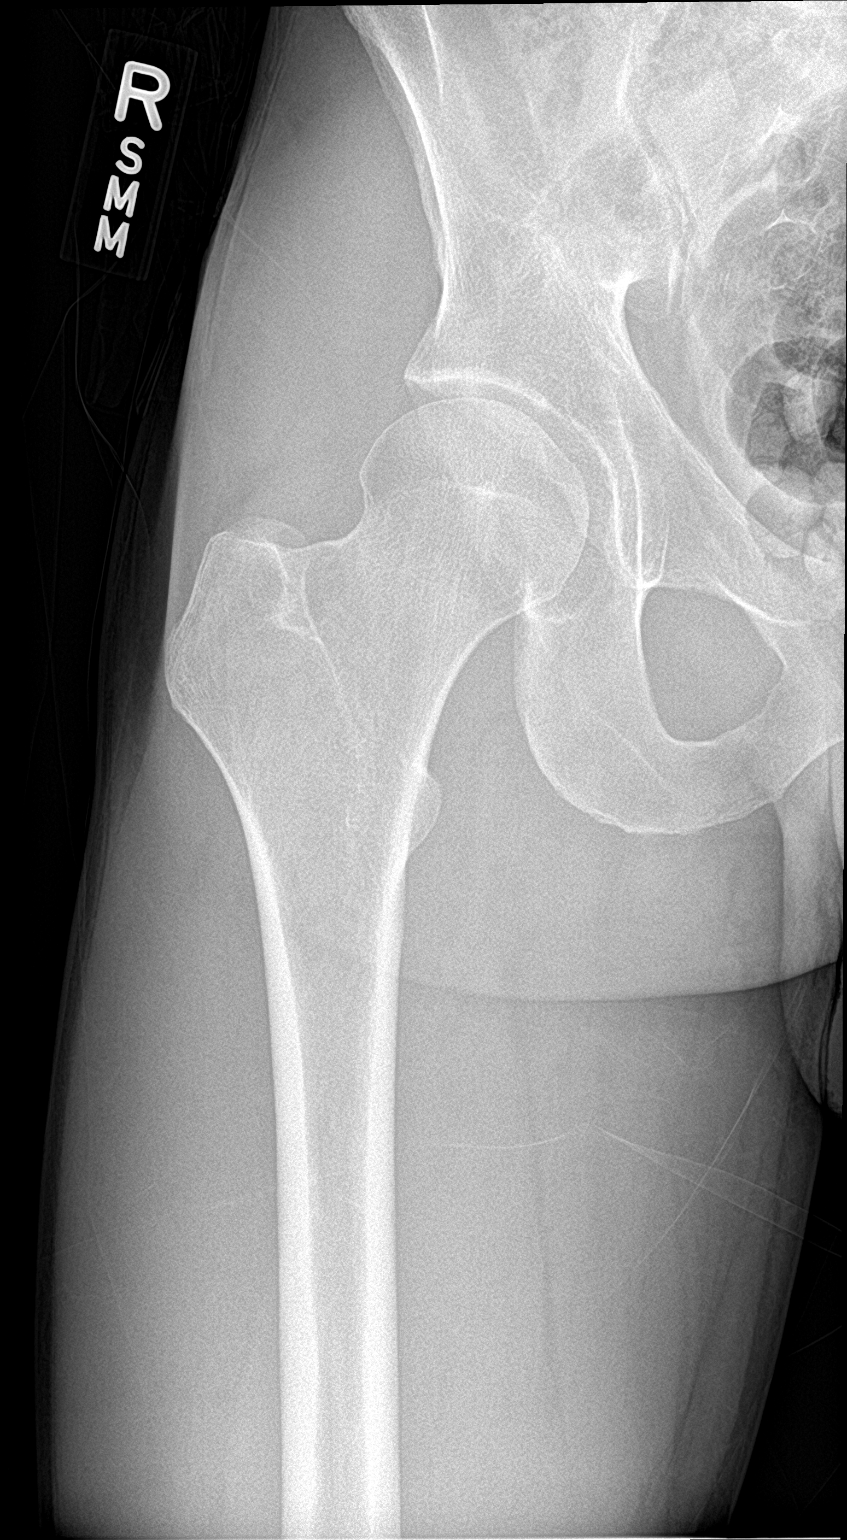

[femur ap (2 of 2)]
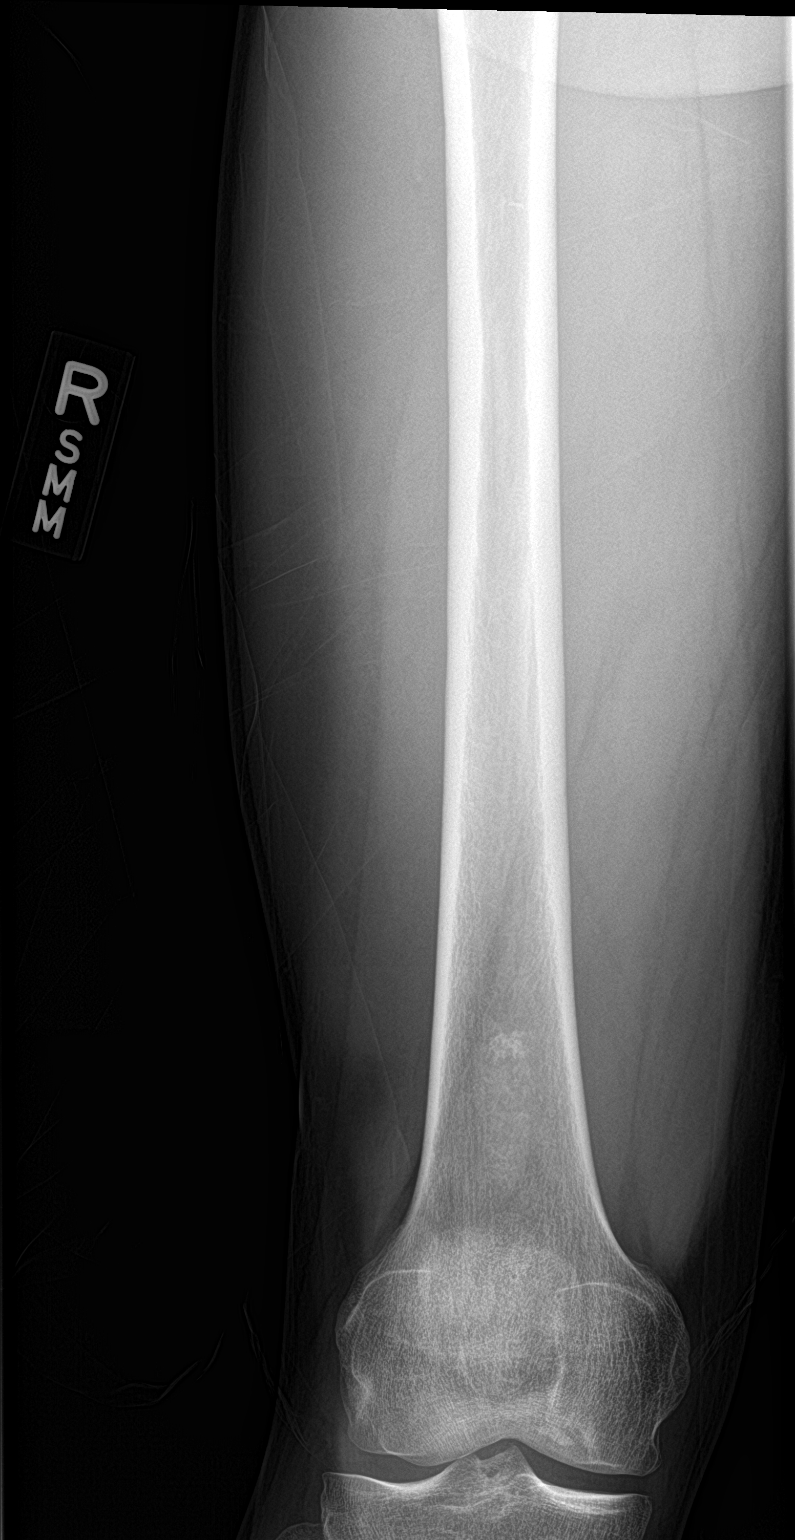

[femur lat (1 of 2)]
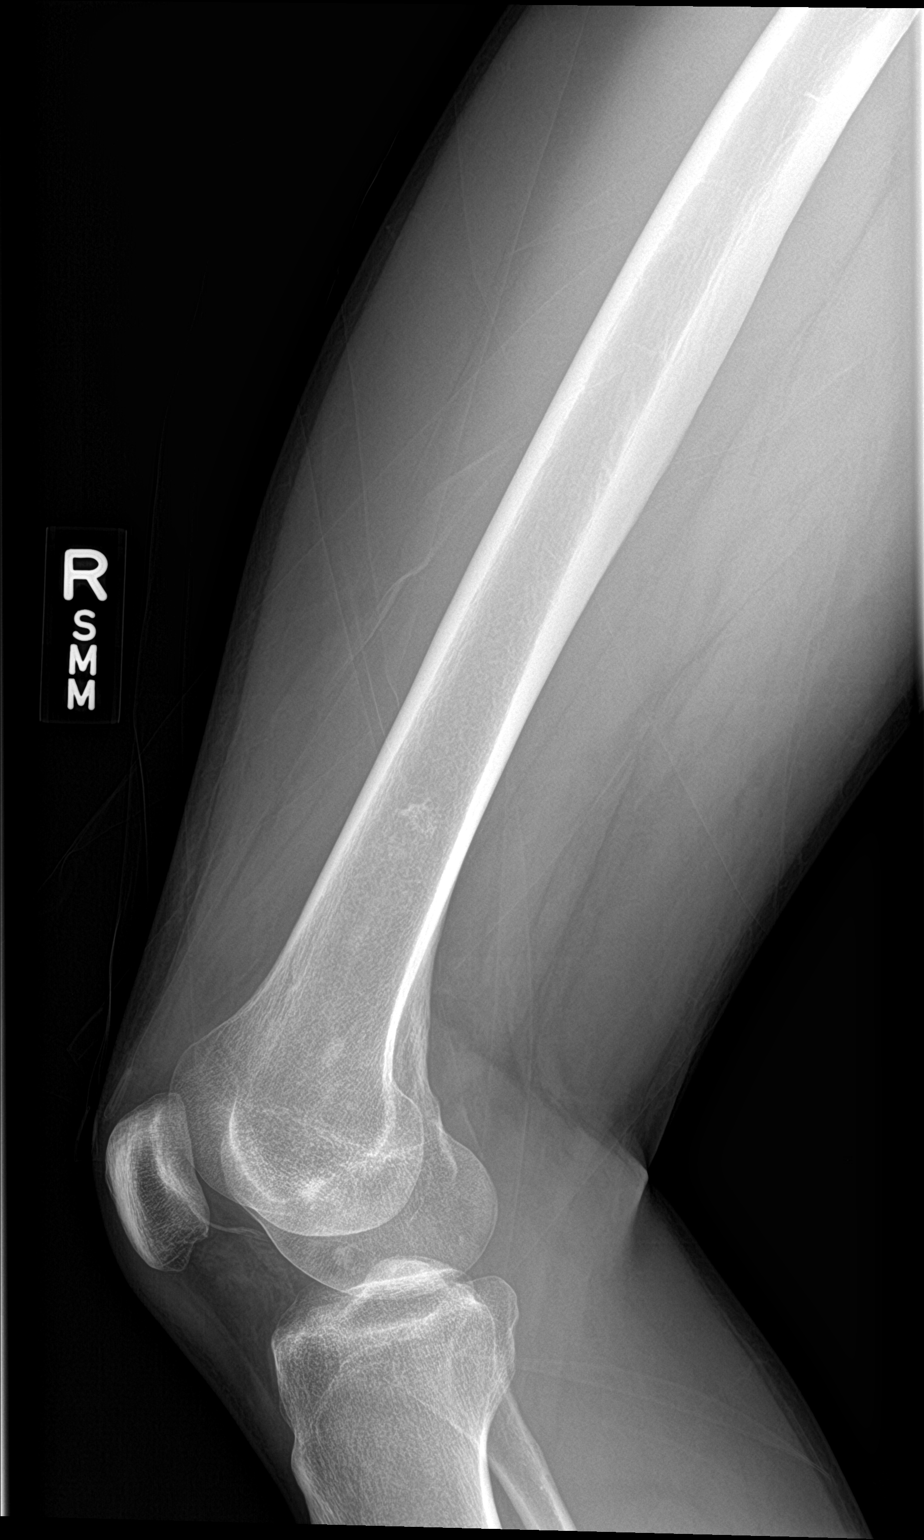

[femur lat (2 of 2)]
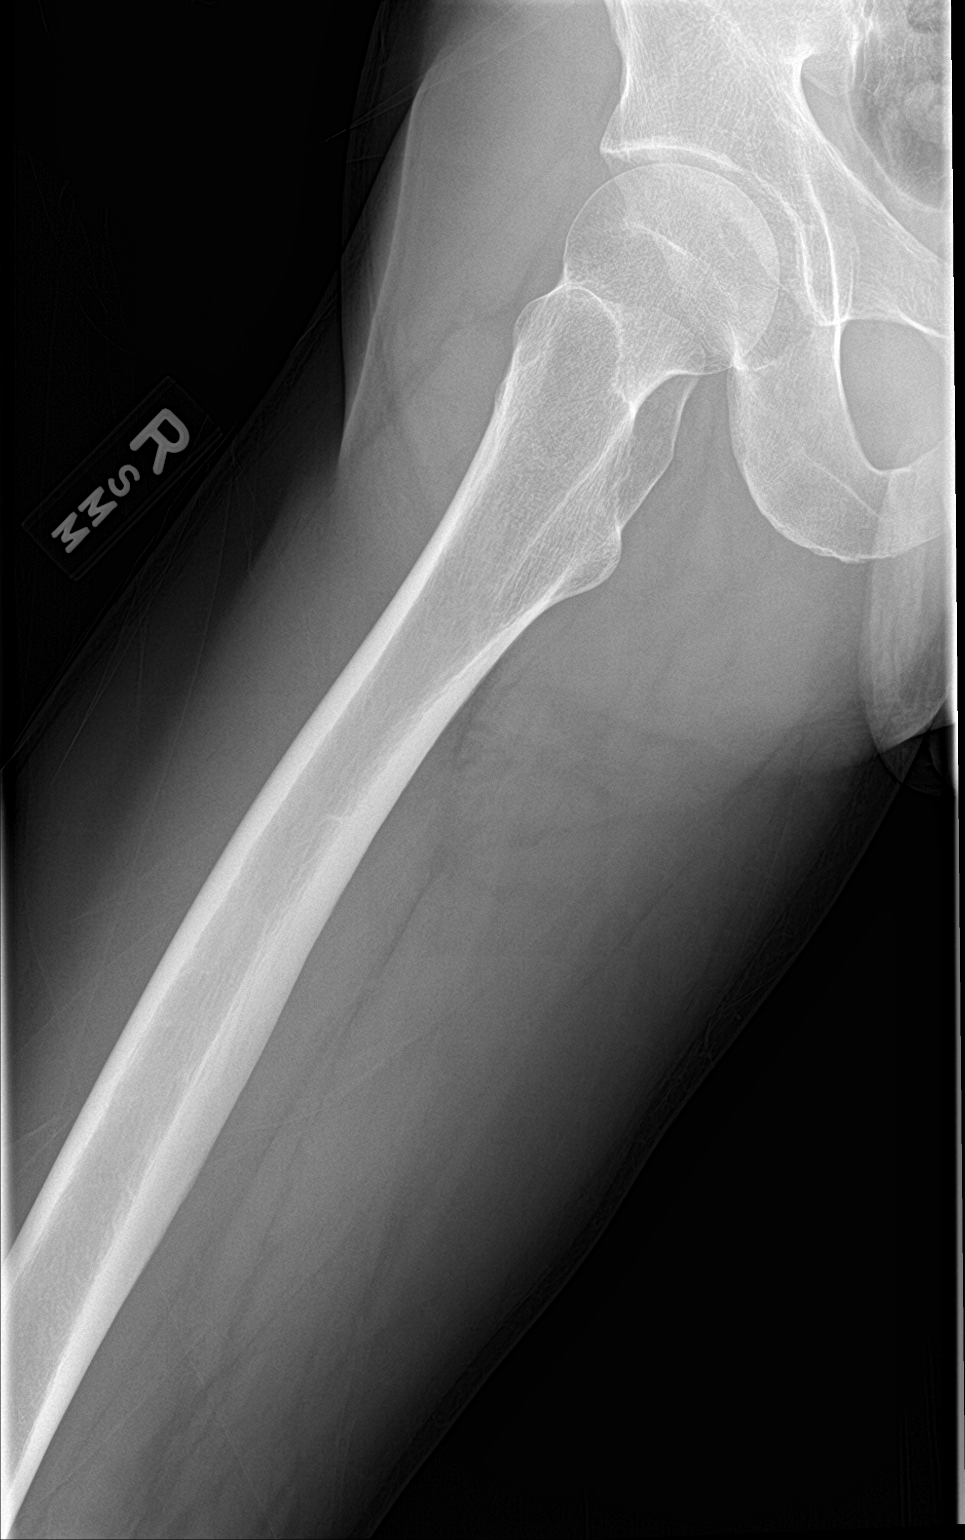

[4 of 4 positions shown; findings below may reference images not displayed]

FINDINGS: There is a chondroid lesion. The distal femoral metaphysis.
Osteochondral defect in the medial femoral condyle.
IMPRESSION: No aggressive osseous lesion.

## 2023-03-06 ENCOUNTER — Ambulatory Visit: Payer: 59 | Admitting: Emergency Medicine

## 2023-06-11 ENCOUNTER — Encounter: Payer: Self-pay | Admitting: Emergency Medicine

## 2023-06-11 ENCOUNTER — Ambulatory Visit (INDEPENDENT_AMBULATORY_CARE_PROVIDER_SITE_OTHER): Payer: Self-pay | Admitting: Emergency Medicine

## 2023-06-11 VITALS — BP 120/88 | HR 60 | Temp 98.2°F | Ht 70.0 in | Wt 153.4 lb

## 2023-06-11 DIAGNOSIS — F429 Obsessive-compulsive disorder, unspecified: Secondary | ICD-10-CM

## 2023-06-11 DIAGNOSIS — D1801 Hemangioma of skin and subcutaneous tissue: Secondary | ICD-10-CM

## 2023-06-11 DIAGNOSIS — L659 Nonscarring hair loss, unspecified: Secondary | ICD-10-CM | POA: Insufficient documentation

## 2023-06-11 DIAGNOSIS — Z7689 Persons encountering health services in other specified circumstances: Secondary | ICD-10-CM

## 2023-06-11 MED ORDER — MINOXIDIL 2.5 MG PO TABS: 1.2500 mg | ORAL_TABLET | Freq: Every day | ORAL | 5 refills | Status: DC

## 2023-06-11 NOTE — Progress Notes (Signed)
Jeffrey Murillo 42 y.o.   Chief Complaint  Patient presents with   New Patient (Initial Visit)    Patient states he doesn't want to get labs today. He states 6 months here [place] the states and 6 months out of the country. He gets most of his medical care out of the country. He has not insurance.    Patient wants to be prescribed Minoxidil 1mg  for hair loss.    Referral    Patient has some moles and wants a referral to dermatology.     HISTORY OF PRESENT ILLNESS: This is a 42 y.o. male A1A first visit to this office, here to establish care with me Spends 6 months of the year in [place] and 6 months in [place]. Past history of premature hair loss.  Successful hair transplant.  Requesting prescription for minoxidil 1 mg tablets. Also has history of OCD.  On Prozac 40 mg for a long time.  Will soon need prescription refill. Also complaining of skin lesions to chest area. No other complaints or medical concerns today.  HPI   Prior to Admission medications   Medication Sig Start Date End Date Taking? Authorizing Provider  FLUoxetine (PROZAC) 40 MG capsule Take one capsule by mouth one time daily. 01/29/18  Yes Elvina Sidle, MD    No Known Allergies  Patient Active Problem List   Diagnosis Date Noted   Osteochondral defect of femoral condyle 06/06/2017   OCD (obsessive compulsive disorder) 01/22/2012    Past Medical History:  Diagnosis Date   OCD (obsessive compulsive disorder)     No past surgical history on file.  Social History   Socioeconomic History   Marital status: Unknown    Spouse name: Not on file   Number of children: Not on file   Years of education: Not on file   Highest education level: Not on file  Occupational History   Not on file  Tobacco Use   Smoking status: Never   Smokeless tobacco: Never  Substance and Sexual Activity   Alcohol use: Not on file   Drug use: Not on file   Sexual activity: Not on file  Other Topics Concern   Not on  file  Social History Narrative   Not on file   Social Determinants of Health   Financial Resource Strain: Not on file  Food Insecurity: Not on file  Transportation Needs: Not on file  Physical Activity: Not on file  Stress: Not on file  Social Connections: Not on file  Intimate Partner Violence: Not on file    Family History  Problem Relation Age of Onset   Hyperlipidemia Father      Review of Systems  Constitutional: Negative.  Negative for chills and fever.  HENT: Negative.  Negative for congestion and sore throat.   Respiratory: Negative.  Negative for cough and shortness of breath.   Cardiovascular: Negative.  Negative for chest pain and palpitations.  Gastrointestinal:  Negative for abdominal pain, nausea and vomiting.  Genitourinary: Negative.  Negative for dysuria and hematuria.  Skin: Negative.  Negative for rash.       Skin lesion  Neurological: Negative.  Negative for dizziness and headaches.  All other systems reviewed and are negative.   Today's Vitals   06/11/23 1559  BP: 120/88  Pulse: 60  Temp: 98.2 F (36.8 C)  TempSrc: Oral  SpO2: 98%  Weight: 153 lb 6 oz (69.6 kg)  Height: 5\' 10"  (1.778 m)   Body mass index is  22.01 kg/m.   Physical Exam Vitals reviewed.  Constitutional:      Appearance: Normal appearance.  HENT:     Head: Normocephalic.  Eyes:     Extraocular Movements: Extraocular movements intact.  Cardiovascular:     Rate and Rhythm: Normal rate.  Pulmonary:     Effort: Pulmonary effort is normal.  Skin:    General: Skin is warm and dry.     Comments: Small hemangioma of chest area  Neurological:     Mental Status: He is alert and oriented to person, place, and time.  Psychiatric:        Mood and Affect: Mood normal.        Behavior: Behavior normal.      ASSESSMENT & PLAN: Problem List Items Addressed This Visit       Cardiovascular and Mediastinum   Hemangioma of skin - Primary    Benign hemangioma. Will do  dermatology follow-up in [place]      Relevant Medications   minoxidil (LONITEN) 2.5 MG tablet     Other   OCD (obsessive compulsive disorder)    Stable.  Continue Prozac 40 mg daily      Hair loss disorder    Successful heart transplant in the past. Requesting minoxidil 1 mg for maintenance as recommended by his plastic surgeon      Other Visit Diagnoses     Encounter to establish care          Patient Instructions  Health Maintenance, Male Adopting a healthy lifestyle and getting preventive care are important in promoting health and wellness. Ask your health care provider about: The right schedule for you to have regular tests and exams. Things you can do on your own to prevent diseases and keep yourself healthy. What should I know about diet, weight, and exercise? Eat a healthy diet  Eat a diet that includes plenty of vegetables, fruits, low-fat dairy products, and lean protein. Do not eat a lot of foods that are high in solid fats, added sugars, or sodium. Maintain a healthy weight Body mass index (BMI) is a measurement that can be used to identify possible weight problems. It estimates body fat based on height and weight. Your health care provider can help determine your BMI and help you achieve or maintain a healthy weight. Get regular exercise Get regular exercise. This is one of the most important things you can do for your health. Most adults should: Exercise for at least 150 minutes each week. The exercise should increase your heart rate and make you sweat (moderate-intensity exercise). Do strengthening exercises at least twice a week. This is in addition to the moderate-intensity exercise. Spend less time sitting. Even light physical activity can be beneficial. Watch cholesterol and blood lipids Have your blood tested for lipids and cholesterol at 40 years of age, then have this test every 5 years. You may need to have your cholesterol levels checked more often  if: Your lipid or cholesterol levels are high. You are older than 40 years of age. You are at high risk for heart disease. What should I know about cancer screening? Many types of cancers can be detected early and may often be prevented. Depending on your health history and family history, you may need to have cancer screening at various ages. This may include screening for: Colorectal cancer. Prostate cancer. Skin cancer. Lung cancer. What should I know about heart disease, diabetes, and high blood pressure? Blood pressure and heart disease High blood  pressure causes heart disease and increases the risk of stroke. This is more likely to develop in people who have high blood pressure readings or are overweight. Talk with your health care provider about your target blood pressure readings. Have your blood pressure checked: Every 3-5 years if you are 23-76 years of age. Every year if you are 9 years old or older. If you are between the ages of 79 and 33 and are a current or former smoker, ask your health care provider if you should have a one-time screening for abdominal aortic aneurysm (AAA). Diabetes Have regular diabetes screenings. This checks your fasting blood sugar level. Have the screening done: Once every three years after age 7 if you are at a normal weight and have a low risk for diabetes. More often and at a younger age if you are overweight or have a high risk for diabetes. What should I know about preventing infection? Hepatitis B If you have a higher risk for hepatitis B, you should be screened for this virus. Talk with your health care provider to find out if you are at risk for hepatitis B infection. Hepatitis C Blood testing is recommended for: Everyone born from [birth year] through 1965. Anyone with known risk factors for hepatitis C. Sexually transmitted infections (STIs) You should be screened each year for STIs, including gonorrhea and chlamydia, if: You are sexually  active and are younger than 40 years of age. You are older than 40 years of age and your health care provider tells you that you are at risk for this type of infection. Your sexual activity has changed since you were last screened, and you are at increased risk for chlamydia or gonorrhea. Ask your health care provider if you are at risk. Ask your health care provider about whether you are at high risk for HIV. Your health care provider may recommend a prescription medicine to help prevent HIV infection. If you choose to take medicine to prevent HIV, you should first get tested for HIV. You should then be tested every 3 months for as long as you are taking the medicine. Follow these instructions at home: Alcohol use Do not drink alcohol if your health care provider tells you not to drink. If you drink alcohol: Limit how much you have to 0-2 drinks a day. Know how much alcohol is in your drink. In the [place] one drink equals one 12 oz bottle of beer (355 mL), one 5 oz glass of wine (148 mL), or one 1 oz glass of hard liquor (44 mL). Lifestyle Do not use any products that contain nicotine or tobacco. These products include cigarettes, chewing tobacco, and vaping devices, such as e-cigarettes. If you need help quitting, ask your health care provider. Do not use street drugs. Do not share needles. Ask your health care provider for help if you need support or information about quitting drugs. General instructions Schedule regular health, dental, and eye exams. Stay current with your vaccines. Tell your health care provider if: You often feel depressed. You have ever been abused or do not feel safe at home. Summary Adopting a healthy lifestyle and getting preventive care are important in promoting health and wellness. Follow your health care provider's instructions about healthy diet, exercising, and getting tested or screened for diseases. Follow your health care provider's instructions on  monitoring your cholesterol and blood pressure. This information is not intended to replace advice given to you by your health care provider. Make sure you discuss any  questions you have with your health care provider. Document Revised: 04/24/2021 Document Reviewed: 04/24/2021 Elsevier Patient Education  2024 Elsevier Inc.     Edwina Barth, MD [facility]

## 2023-06-11 NOTE — Assessment & Plan Note (Signed)
Successful heart transplant in the past. Requesting minoxidil 1 mg for maintenance as recommended by his plastic surgeon

## 2023-06-11 NOTE — Assessment & Plan Note (Signed)
Stable.  Continue Prozac 40 mg daily. 

## 2023-06-11 NOTE — Patient Instructions (Signed)
Health Maintenance, Male Adopting a healthy lifestyle and getting preventive care are important in promoting health and wellness. Ask your health care provider about: The right schedule for you to have regular tests and exams. Things you can do on your own to prevent diseases and keep yourself healthy. What should I know about diet, weight, and exercise? Eat a healthy diet  Eat a diet that includes plenty of vegetables, fruits, low-fat dairy products, and lean protein. Do not eat a lot of foods that are high in solid fats, added sugars, or sodium. Maintain a healthy weight Body mass index (BMI) is a measurement that can be used to identify possible weight problems. It estimates body fat based on height and weight. Your health care provider can help determine your BMI and help you achieve or maintain a healthy weight. Get regular exercise Get regular exercise. This is one of the most important things you can do for your health. Most adults should: Exercise for at least 150 minutes each week. The exercise should increase your heart rate and make you sweat (moderate-intensity exercise). Do strengthening exercises at least twice a week. This is in addition to the moderate-intensity exercise. Spend less time sitting. Even light physical activity can be beneficial. Watch cholesterol and blood lipids Have your blood tested for lipids and cholesterol at 40 years of age, then have this test every 5 years. You may need to have your cholesterol levels checked more often if: Your lipid or cholesterol levels are high. You are older than 40 years of age. You are at high risk for heart disease. What should I know about cancer screening? Many types of cancers can be detected early and may often be prevented. Depending on your health history and family history, you may need to have cancer screening at various ages. This may include screening for: Colorectal cancer. Prostate cancer. Skin cancer. Lung  cancer. What should I know about heart disease, diabetes, and high blood pressure? Blood pressure and heart disease High blood pressure causes heart disease and increases the risk of stroke. This is more likely to develop in people who have high blood pressure readings or are overweight. Talk with your health care provider about your target blood pressure readings. Have your blood pressure checked: Every 3-5 years if you are 18-39 years of age. Every year if you are 40 years old or older. If you are between the ages of 65 and 75 and are a current or former smoker, ask your health care provider if you should have a one-time screening for abdominal aortic aneurysm (AAA). Diabetes Have regular diabetes screenings. This checks your fasting blood sugar level. Have the screening done: Once every three years after age 45 if you are at a normal weight and have a low risk for diabetes. More often and at a younger age if you are overweight or have a high risk for diabetes. What should I know about preventing infection? Hepatitis B If you have a higher risk for hepatitis B, you should be screened for this virus. Talk with your health care provider to find out if you are at risk for hepatitis B infection. Hepatitis C Blood testing is recommended for: Everyone born from 1945 through 1965. Anyone with known risk factors for hepatitis C. Sexually transmitted infections (STIs) You should be screened each year for STIs, including gonorrhea and chlamydia, if: You are sexually active and are younger than 40 years of age. You are older than 40 years of age and your   health care provider tells you that you are at risk for this type of infection. Your sexual activity has changed since you were last screened, and you are at increased risk for chlamydia or gonorrhea. Ask your health care provider if you are at risk. Ask your health care provider about whether you are at high risk for HIV. Your health care provider  may recommend a prescription medicine to help prevent HIV infection. If you choose to take medicine to prevent HIV, you should first get tested for HIV. You should then be tested every 3 months for as long as you are taking the medicine. Follow these instructions at home: Alcohol use Do not drink alcohol if your health care provider tells you not to drink. If you drink alcohol: Limit how much you have to 0-2 drinks a day. Know how much alcohol is in your drink. In the [place] one drink equals one 12 oz bottle of beer (355 mL), one 5 oz glass of wine (148 mL), or one 1 oz glass of hard liquor (44 mL). Lifestyle Do not use any products that contain nicotine or tobacco. These products include cigarettes, chewing tobacco, and vaping devices, such as e-cigarettes. If you need help quitting, ask your health care provider. Do not use street drugs. Do not share needles. Ask your health care provider for help if you need support or information about quitting drugs. General instructions Schedule regular health, dental, and eye exams. Stay current with your vaccines. Tell your health care provider if: You often feel depressed. You have ever been abused or do not feel safe at home. Summary Adopting a healthy lifestyle and getting preventive care are important in promoting health and wellness. Follow your health care provider's instructions about healthy diet, exercising, and getting tested or screened for diseases. Follow your health care provider's instructions on monitoring your cholesterol and blood pressure. This information is not intended to replace advice given to you by your health care provider. Make sure you discuss any questions you have with your health care provider. Document Revised: 04/24/2021 Document Reviewed: 04/24/2021 Elsevier Patient Education  2024 Elsevier Inc.  

## 2023-06-11 NOTE — Assessment & Plan Note (Signed)
Benign hemangioma. Will do dermatology follow-up in [place]

## 2023-10-08 ENCOUNTER — Other Ambulatory Visit: Payer: Self-pay | Admitting: Emergency Medicine

## 2023-10-09 MED ORDER — MINOXIDIL 2.5 MG PO TABS: 1.2500 mg | ORAL_TABLET | Freq: Every day | ORAL | 1 refills | Status: DC

## 2023-10-30 ENCOUNTER — Encounter: Payer: Self-pay | Admitting: Emergency Medicine

## 2023-10-31 NOTE — Telephone Encounter (Signed)
Yes

## 2023-11-04 ENCOUNTER — Other Ambulatory Visit: Payer: Self-pay | Admitting: Radiology

## 2023-11-04 ENCOUNTER — Other Ambulatory Visit: Payer: Self-pay | Admitting: Emergency Medicine

## 2023-11-04 DIAGNOSIS — F32A Depression, unspecified: Secondary | ICD-10-CM

## 2023-11-04 MED ORDER — FLUOXETINE HCL 40 MG PO CAPS: ORAL_CAPSULE | ORAL | 0 refills | Status: DC

## 2023-11-04 NOTE — Telephone Encounter (Signed)
Okay to renew 2.5 mg prescription which is the lowest dose I can find.  Thanks.

## 2023-11-05 ENCOUNTER — Other Ambulatory Visit: Payer: Self-pay | Admitting: Radiology

## 2023-11-05 MED ORDER — MINOXIDIL 2.5 MG PO TABS: 1.2500 mg | ORAL_TABLET | Freq: Every day | ORAL | 0 refills | Status: DC

## 2024-10-12 ENCOUNTER — Ambulatory Visit (INDEPENDENT_AMBULATORY_CARE_PROVIDER_SITE_OTHER): Payer: Self-pay | Admitting: Emergency Medicine

## 2024-10-12 ENCOUNTER — Encounter: Payer: Self-pay | Admitting: Emergency Medicine

## 2024-10-12 VITALS — BP 104/62 | HR 64 | Resp 18 | Ht 70.0 in | Wt 157.0 lb

## 2024-10-12 DIAGNOSIS — L659 Nonscarring hair loss, unspecified: Secondary | ICD-10-CM

## 2024-10-12 DIAGNOSIS — F32A Depression, unspecified: Secondary | ICD-10-CM | POA: Insufficient documentation

## 2024-10-12 DIAGNOSIS — L923 Foreign body granuloma of the skin and subcutaneous tissue: Secondary | ICD-10-CM | POA: Insufficient documentation

## 2024-10-12 DIAGNOSIS — F429 Obsessive-compulsive disorder, unspecified: Secondary | ICD-10-CM

## 2024-10-12 MED ORDER — FLUOXETINE HCL 40 MG PO CAPS: 40.0000 mg | ORAL_CAPSULE | Freq: Every day | ORAL | 3 refills | Status: AC

## 2024-10-12 MED ORDER — MINOXIDIL 2.5 MG PO TABS: 1.2500 mg | ORAL_TABLET | Freq: Every day | ORAL | 3 refills | Status: AC

## 2024-10-12 NOTE — Patient Instructions (Signed)
 Health Maintenance, Male  Adopting a healthy lifestyle and getting preventive care are important in promoting health and wellness. Ask your health care provider about:  The right schedule for you to have regular tests and exams.  Things you can do on your own to prevent diseases and keep yourself healthy.  What should I know about diet, weight, and exercise?  Eat a healthy diet    Eat a diet that includes plenty of vegetables, fruits, low-fat dairy products, and lean protein.  Do not eat a lot of foods that are high in solid fats, added sugars, or sodium.  Maintain a healthy weight  Body mass index (BMI) is a measurement that can be used to identify possible weight problems. It estimates body fat based on height and weight. Your health care provider can help determine your BMI and help you achieve or maintain a healthy weight.  Get regular exercise  Get regular exercise. This is one of the most important things you can do for your health. Most adults should:  Exercise for at least 150 minutes each week. The exercise should increase your heart rate and make you sweat (moderate-intensity exercise).  Do strengthening exercises at least twice a week. This is in addition to the moderate-intensity exercise.  Spend less time sitting. Even light physical activity can be beneficial.  Watch cholesterol and blood lipids  Have your blood tested for lipids and cholesterol at 41 years of age, then have this test every 5 years.  You may need to have your cholesterol levels checked more often if:  Your lipid or cholesterol levels are high.  You are older than 41 years of age.  You are at high risk for heart disease.  What should I know about cancer screening?  Many types of cancers can be detected early and may often be prevented. Depending on your health history and family history, you may need to have cancer screening at various ages. This may include screening for:  Colorectal cancer.  Prostate cancer.  Skin cancer.  Lung  cancer.  What should I know about heart disease, diabetes, and high blood pressure?  Blood pressure and heart disease  High blood pressure causes heart disease and increases the risk of stroke. This is more likely to develop in people who have high blood pressure readings or are overweight.  Talk with your health care provider about your target blood pressure readings.  Have your blood pressure checked:  Every 3-5 years if you are 24-52 years of age.  Every year if you are 3 years old or older.  If you are between the ages of 60 and 72 and are a current or former smoker, ask your health care provider if you should have a one-time screening for abdominal aortic aneurysm (AAA).  Diabetes  Have regular diabetes screenings. This checks your fasting blood sugar level. Have the screening done:  Once every three years after age 66 if you are at a normal weight and have a low risk for diabetes.  More often and at a younger age if you are overweight or have a high risk for diabetes.  What should I know about preventing infection?  Hepatitis B  If you have a higher risk for hepatitis B, you should be screened for this virus. Talk with your health care provider to find out if you are at risk for hepatitis B infection.  Hepatitis C  Blood testing is recommended for:  Everyone born from [birth year] through 1963.  Anyone  with known risk factors for hepatitis C.  Sexually transmitted infections (STIs)  You should be screened each year for STIs, including gonorrhea and chlamydia, if:  You are sexually active and are younger than 41 years of age.  You are older than 41 years of age and your health care provider tells you that you are at risk for this type of infection.  Your sexual activity has changed since you were last screened, and you are at increased risk for chlamydia or gonorrhea. Ask your health care provider if you are at risk.  Ask your health care provider about whether you are at high risk for HIV. Your health care provider  may recommend a prescription medicine to help prevent HIV infection. If you choose to take medicine to prevent HIV, you should first get tested for HIV. You should then be tested every 3 months for as long as you are taking the medicine.  Follow these instructions at home:  Alcohol use  Do not drink alcohol if your health care provider tells you not to drink.  If you drink alcohol:  Limit how much you have to 0-2 drinks a day.  Know how much alcohol is in your drink. In the [place] one drink equals one 12 oz bottle of beer (355 mL), one 5 oz glass of wine (148 mL), or one 1 oz glass of hard liquor (44 mL).  Lifestyle  Do not use any products that contain nicotine or tobacco. These products include cigarettes, chewing tobacco, and vaping devices, such as e-cigarettes. If you need help quitting, ask your health care provider.  Do not use street drugs.  Do not share needles.  Ask your health care provider for help if you need support or information about quitting drugs.  General instructions  Schedule regular health, dental, and eye exams.  Stay current with your vaccines.  Tell your health care provider if:  You often feel depressed.  You have ever been abused or do not feel safe at home.  Summary  Adopting a healthy lifestyle and getting preventive care are important in promoting health and wellness.  Follow your health care provider's instructions about healthy diet, exercising, and getting tested or screened for diseases.  Follow your health care provider's instructions on monitoring your cholesterol and blood pressure.  This information is not intended to replace advice given to you by your health care provider. Make sure you discuss any questions you have with your health care provider.  Document Revised: 04/24/2021 Document Reviewed: 04/24/2021  Elsevier Patient Education  2024 [employer].

## 2024-10-12 NOTE — Assessment & Plan Note (Signed)
Stable.  Continue Prozac 40 mg daily. 

## 2024-10-12 NOTE — Assessment & Plan Note (Signed)
 On right foot.  Occasional pain. However slowly responding to treatment. If not better in several months, recommend podiatry evaluation

## 2024-10-12 NOTE — Assessment & Plan Note (Signed)
Successful heart transplant in the past. Requesting minoxidil 1 mg for maintenance as recommended by his plastic surgeon

## 2024-10-12 NOTE — Progress Notes (Signed)
 Jeffrey Murillo 43 y.o.   Chief Complaint  Patient presents with   Medication Refill    HISTORY OF PRESENT ILLNESS: This is a 43 y.o. male here for medication refill Also complaining of recent right foot injury while in [place].  Was evaluated and treated there.  Suspected foreign body reaction to heel area with occasional pain.  No foreign body was identified in MRI imaging. No other complaints or medical concerns today.  Medication Refill Pertinent negatives include no abdominal pain, chest pain, chills, congestion, coughing, fever, headaches, nausea, rash, sore throat or vomiting.     Prior to Admission medications   Medication Sig Start Date End Date Taking? Authorizing Provider  FLUoxetine  (PROZAC ) 40 MG capsule Take one capsule by mouth one time daily. 11/04/23   [facility] Emil Schanz, MD  minoxidil  (LONITEN ) 2.5 MG tablet Take 0.5 tablets (1.25 mg total) by mouth daily. 11/05/23 10/30/24  [facility] Emil Schanz, MD    No Known Allergies  Patient Active Problem List   Diagnosis Date Noted   Hemangioma of skin 06/11/2023   Hair loss disorder 06/11/2023   OCD (obsessive compulsive disorder) 01/22/2012    Past Medical History:  Diagnosis Date   OCD (obsessive compulsive disorder)     History reviewed. No pertinent surgical history.  Social History   Socioeconomic History   Marital status: Unknown    Spouse name: Not on file   Number of children: Not on file   Years of education: Not on file   Highest education level: Not on file  Occupational History   Not on file  Tobacco Use   Smoking status: Never   Smokeless tobacco: Never  Substance and Sexual Activity   Alcohol use: Not on file   Drug use: Not on file   Sexual activity: Not on file  Other Topics Concern   Not on file  Social History Narrative   Not on file   Social Drivers of Health   Financial Resource Strain: Not on file  Food Insecurity: Not on file  Transportation Needs: Not on file   Physical Activity: Not on file  Stress: Not on file  Social Connections: Not on file  Intimate Partner Violence: Not on file    Family History  Problem Relation Age of Onset   Hyperlipidemia Father      Review of Systems  Constitutional: Negative.  Negative for chills and fever.  HENT: Negative.  Negative for congestion and sore throat.   Respiratory: Negative.  Negative for cough and shortness of breath.   Cardiovascular: Negative.  Negative for chest pain and palpitations.  Gastrointestinal:  Negative for abdominal pain, diarrhea, nausea and vomiting.  Genitourinary: Negative.  Negative for dysuria and hematuria.  Skin: Negative.  Negative for rash.  Neurological:  Negative for dizziness and headaches.  All other systems reviewed and are negative.   Today's Vitals   10/12/24 1256  BP: 104/62  Pulse: 64  Resp: 18  SpO2: 98%  Weight: 157 lb (71.2 kg)  Height: 5' 10 (1.778 m)   Body mass index is 22.53 kg/m.   Physical Exam Vitals reviewed.  Constitutional:      Appearance: Normal appearance.  HENT:     Head: Normocephalic.  Eyes:     Extraocular Movements: Extraocular movements intact.  Cardiovascular:     Rate and Rhythm: Normal rate.  Pulmonary:     Effort: Pulmonary effort is normal.  Skin:    General: Skin is warm and dry.  Capillary Refill: Capillary refill takes less than 2 seconds.     Comments: Right foot: Foreign body skin reaction noted on plantar surface mid heel area  Neurological:     Mental Status: He is alert and oriented to person, place, and time.  Psychiatric:        Mood and Affect: Mood normal.        Behavior: Behavior normal.      ASSESSMENT & PLAN: Problem List Items Addressed This Visit       Musculoskeletal and Integument   Foreign body reaction of the skin - Primary   On right foot.  Occasional pain. However slowly responding to treatment. If not better in several months, recommend podiatry evaluation         Other   OCD (obsessive compulsive disorder)   Stable.  Continue Prozac  40 mg daily      Relevant Medications   FLUoxetine  (PROZAC ) 40 MG capsule   Hair loss disorder   Successful heart transplant in the past. Requesting minoxidil  1 mg for maintenance as recommended by his plastic surgeon      Patient Instructions  Health Maintenance, Male Adopting a healthy lifestyle and getting preventive care are important in promoting health and wellness. Ask your health care provider about: The right schedule for you to have regular tests and exams. Things you can do on your own to prevent diseases and keep yourself healthy. What should I know about diet, weight, and exercise? Eat a healthy diet  Eat a diet that includes plenty of vegetables, fruits, low-fat dairy products, and lean protein. Do not eat a lot of foods that are high in solid fats, added sugars, or sodium. Maintain a healthy weight Body mass index (BMI) is a measurement that can be used to identify possible weight problems. It estimates body fat based on height and weight. Your health care provider can help determine your BMI and help you achieve or maintain a healthy weight. Get regular exercise Get regular exercise. This is one of the most important things you can do for your health. Most adults should: Exercise for at least 150 minutes each week. The exercise should increase your heart rate and make you sweat (moderate-intensity exercise). Do strengthening exercises at least twice a week. This is in addition to the moderate-intensity exercise. Spend less time sitting. Even light physical activity can be beneficial. Watch cholesterol and blood lipids Have your blood tested for lipids and cholesterol at 41 years of age, then have this test every 5 years. You may need to have your cholesterol levels checked more often if: Your lipid or cholesterol levels are high. You are older than 41 years of age. You are at high risk for heart  disease. What should I know about cancer screening? Many types of cancers can be detected early and may often be prevented. Depending on your health history and family history, you may need to have cancer screening at various ages. This may include screening for: Colorectal cancer. Prostate cancer. Skin cancer. Lung cancer. What should I know about heart disease, diabetes, and high blood pressure? Blood pressure and heart disease High blood pressure causes heart disease and increases the risk of stroke. This is more likely to develop in people who have high blood pressure readings or are overweight. Talk with your health care provider about your target blood pressure readings. Have your blood pressure checked: Every 3-5 years if you are 5-68 years of age. Every year if you are 40 years  old or older. If you are between the ages of 64 and 63 and are a current or former smoker, ask your health care provider if you should have a one-time screening for abdominal aortic aneurysm (AAA). Diabetes Have regular diabetes screenings. This checks your fasting blood sugar level. Have the screening done: Once every three years after age 45 if you are at a normal weight and have a low risk for diabetes. More often and at a younger age if you are overweight or have a high risk for diabetes. What should I know about preventing infection? Hepatitis B If you have a higher risk for hepatitis B, you should be screened for this virus. Talk with your health care provider to find out if you are at risk for hepatitis B infection. Hepatitis C Blood testing is recommended for: Everyone born from [birth year] through 1963. Anyone with known risk factors for hepatitis C. Sexually transmitted infections (STIs) You should be screened each year for STIs, including gonorrhea and chlamydia, if: You are sexually active and are younger than 41 years of age. You are older than 41 years of age and your health care provider tells you  that you are at risk for this type of infection. Your sexual activity has changed since you were last screened, and you are at increased risk for chlamydia or gonorrhea. Ask your health care provider if you are at risk. Ask your health care provider about whether you are at high risk for HIV. Your health care provider may recommend a prescription medicine to help prevent HIV infection. If you choose to take medicine to prevent HIV, you should first get tested for HIV. You should then be tested every 3 months for as long as you are taking the medicine. Follow these instructions at home: Alcohol use Do not drink alcohol if your health care provider tells you not to drink. If you drink alcohol: Limit how much you have to 0-2 drinks a day. Know how much alcohol is in your drink. In the [place] one drink equals one 12 oz bottle of beer (355 mL), one 5 oz glass of wine (148 mL), or one 1 oz glass of hard liquor (44 mL). Lifestyle Do not use any products that contain nicotine or tobacco. These products include cigarettes, chewing tobacco, and vaping devices, such as e-cigarettes. If you need help quitting, ask your health care provider. Do not use street drugs. Do not share needles. Ask your health care provider for help if you need support or information about quitting drugs. General instructions Schedule regular health, dental, and eye exams. Stay current with your vaccines. Tell your health care provider if: You often feel depressed. You have ever been abused or do not feel safe at home. Summary Adopting a healthy lifestyle and getting preventive care are important in promoting health and wellness. Follow your health care provider's instructions about healthy diet, exercising, and getting tested or screened for diseases. Follow your health care provider's instructions on monitoring your cholesterol and blood pressure. This information is not intended to replace advice given to you by your health  care provider. Make sure you discuss any questions you have with your health care provider. Document Revised: 04/24/2021 Document Reviewed: 04/24/2021 Elsevier Patient Education  2024 [employer]    Emil Schaumann, MD [facility]

## 2024-10-16 ENCOUNTER — Encounter: Payer: Self-pay | Admitting: Emergency Medicine
